# Patient Record
Sex: Female | Born: 1975 | Race: Black or African American | Hispanic: No | Marital: Single | Smoking: Current every day smoker
Health system: Southern US, Community
[De-identification: ages and names within clinical notes are randomized; demographics above are authoritative.]

## PROBLEM LIST (undated history)

## (undated) DIAGNOSIS — Z789 Other specified health status: Secondary | ICD-10-CM

## (undated) DIAGNOSIS — O24419 Gestational diabetes mellitus in pregnancy, unspecified control: Secondary | ICD-10-CM

## (undated) HISTORY — PX: TONSILLECTOMY: SUR1361

## (undated) HISTORY — DX: Other specified health status: Z78.9

## (undated) HISTORY — PX: APPENDECTOMY: SHX54

---

## 2005-04-03 ENCOUNTER — Emergency Department (HOSPITAL_COMMUNITY): Admission: EM | Admit: 2005-04-03 | Discharge: 2005-04-03 | Payer: Self-pay | Admitting: Emergency Medicine

## 2006-01-08 ENCOUNTER — Emergency Department (HOSPITAL_COMMUNITY): Admission: EM | Admit: 2006-01-08 | Discharge: 2006-01-08 | Payer: Self-pay | Admitting: Emergency Medicine

## 2007-07-26 ENCOUNTER — Emergency Department (HOSPITAL_COMMUNITY): Admission: EM | Admit: 2007-07-26 | Discharge: 2007-07-26 | Payer: Self-pay | Admitting: Emergency Medicine

## 2008-09-04 ENCOUNTER — Emergency Department (HOSPITAL_COMMUNITY): Admission: EM | Admit: 2008-09-04 | Discharge: 2008-09-04 | Payer: Self-pay | Admitting: Emergency Medicine

## 2009-01-31 ENCOUNTER — Emergency Department (HOSPITAL_BASED_OUTPATIENT_CLINIC_OR_DEPARTMENT_OTHER): Admission: EM | Admit: 2009-01-31 | Discharge: 2009-01-31 | Payer: Self-pay | Admitting: Emergency Medicine

## 2009-01-31 ENCOUNTER — Ambulatory Visit: Payer: Self-pay | Admitting: Diagnostic Radiology

## 2009-04-24 ENCOUNTER — Emergency Department (HOSPITAL_COMMUNITY): Admission: EM | Admit: 2009-04-24 | Discharge: 2009-04-24 | Payer: Self-pay | Admitting: Family Medicine

## 2009-10-08 ENCOUNTER — Emergency Department (HOSPITAL_COMMUNITY): Admission: EM | Admit: 2009-10-08 | Discharge: 2009-10-08 | Payer: Self-pay | Admitting: Emergency Medicine

## 2010-04-18 LAB — PREGNANCY, URINE: Preg Test, Ur: NEGATIVE

## 2010-05-08 LAB — CBC
Hemoglobin: 14.8 g/dL (ref 12.0–15.0)
MCHC: 33.1 g/dL (ref 30.0–36.0)
RDW: 15.3 % (ref 11.5–15.5)

## 2010-05-08 LAB — DIFFERENTIAL
Basophils Absolute: 0 10*3/uL (ref 0.0–0.1)
Basophils Relative: 0 % (ref 0–1)
Eosinophils Relative: 3 % (ref 0–5)
Lymphocytes Relative: 21 % (ref 12–46)
Neutro Abs: 6.2 10*3/uL (ref 1.7–7.7)

## 2010-05-08 LAB — WET PREP, GENITAL

## 2010-05-08 LAB — BASIC METABOLIC PANEL
CO2: 23 mEq/L (ref 19–32)
Calcium: 9.3 mg/dL (ref 8.4–10.5)
Creatinine, Ser: 0.79 mg/dL (ref 0.4–1.2)
GFR calc Af Amer: >60 mL/min (ref >60)
GFR calc non Af Amer: >60 mL/min (ref >60)
Glucose, Bld: 101 mg/dL — ABNORMAL HIGH (ref 70–99)
Sodium: 137 mEq/L (ref 135–145)

## 2010-05-08 LAB — URINALYSIS, ROUTINE W REFLEX MICROSCOPIC
Glucose, UA: NEGATIVE mg/dL
Nitrite: NEGATIVE
pH: 5.5 (ref 5.0–8.0)

## 2010-05-08 LAB — RPR: RPR Ser Ql: NONREACTIVE

## 2010-05-08 LAB — GC/CHLAMYDIA PROBE AMP, GENITAL: GC Probe Amp, Genital: NEGATIVE

## 2010-10-05 ENCOUNTER — Emergency Department (HOSPITAL_BASED_OUTPATIENT_CLINIC_OR_DEPARTMENT_OTHER)
Admission: EM | Admit: 2010-10-05 | Discharge: 2010-10-05 | Disposition: A | Discharge status: Home / Self Care | Payer: Self-pay | Attending: Emergency Medicine | Admitting: Emergency Medicine

## 2010-10-05 ENCOUNTER — Encounter: Payer: Self-pay | Admitting: *Deleted

## 2010-10-05 ENCOUNTER — Emergency Department (INDEPENDENT_AMBULATORY_CARE_PROVIDER_SITE_OTHER): Payer: Self-pay

## 2010-10-05 DIAGNOSIS — N898 Other specified noninflammatory disorders of vagina: Secondary | ICD-10-CM | POA: Insufficient documentation

## 2010-10-05 DIAGNOSIS — N939 Abnormal uterine and vaginal bleeding, unspecified: Secondary | ICD-10-CM

## 2010-10-05 DIAGNOSIS — F17200 Nicotine dependence, unspecified, uncomplicated: Secondary | ICD-10-CM | POA: Insufficient documentation

## 2010-10-05 LAB — WET PREP, GENITAL: Trich, Wet Prep: NONE SEEN

## 2010-10-05 LAB — PREGNANCY, URINE: Preg Test, Ur: NEGATIVE

## 2010-10-05 MED ORDER — NORGESTREL-ETHINYL ESTRADIOL 0.3-30 MG-MCG PO TABS: 1.0000 | ORAL_TABLET | Freq: Every day | ORAL | Status: DC

## 2010-10-05 MED ORDER — HYDROCODONE-ACETAMINOPHEN 5-325 MG PO TABS: 2.0000 | ORAL_TABLET | ORAL | Status: AC | PRN

## 2010-10-05 MED ORDER — HYDROCODONE-ACETAMINOPHEN 5-325 MG PO TABS
2.0000 | ORAL_TABLET | Freq: Once | ORAL | Status: AC
  Administered 2010-10-05: 2 via ORAL
  Filled 2010-10-05: qty 2

## 2010-10-05 NOTE — ED Notes (Signed)
In to do pt. Vitals and Pt. Not in room.
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
## 2010-10-05 NOTE — ED Notes (Signed)
Vaginal bleeding and abd cramps. 7 weeks preg. Bleeding 2 weeks ago.

## 2010-10-05 NOTE — ED Notes (Signed)
Pt states she has not had a positive preg test. Assumes she is preg.

## 2010-10-05 NOTE — ED Notes (Signed)
Patient given washcloths and towels to freshen up after pelvic exam. She voices no additional concerns at this time

## 2010-10-05 NOTE — ED Provider Notes (Signed)
History     CSN: [id number] Arrival date & time: 10/05/2010  6:44 PM  Chief Complaint  Patient presents with  . Vaginal Bleeding   Patient is a 35 y.o. female presenting with vaginal bleeding. The history is provided by the patient. No language interpreter was used.  Vaginal Bleeding This is a new problem. The current episode started yesterday. The problem occurs constantly. The problem has been unchanged. Associated symptoms include abdominal pain and headaches. Pertinent negatives include no urinary symptoms, vomiting or weakness. The symptoms are aggravated by nothing. She has tried nothing for the symptoms.  Pt reports she had a normal period 2 weeks ago.  Pt reports she awoke today with heavy vaginal bleeding.  Pt reports she has not had a papsmear in 2 years.  Pt reports bleeding is heavier than periods.   History reviewed. No pertinent past medical history.  Past Surgical History  Procedure Date  . Appendectomy   . Tonsillectomy     No family history on file.  History  Substance Use Topics  . Smoking status: Current Everyday Smoker -- 1.0 packs/day  . Smokeless tobacco: Not on file  . Alcohol Use: No    OB History    Grav Para Term Preterm Abortions TAB SAB Ect Mult Living                  Review of Systems  Gastrointestinal: Positive for abdominal pain. Negative for vomiting.  Genitourinary: Positive for vaginal bleeding and pelvic pain.  Neurological: Positive for headaches. Negative for weakness.  All other systems reviewed and are negative.    Physical Exam  BP 123/90  Pulse 68  Temp(Src) 98.1 F (36.7 C) (Oral)  Resp 22  SpO2 100%  LMP 07/21/2010  Physical Exam  Nursing note and vitals reviewed. Constitutional: She is oriented to person, place, and time. She appears well-developed and well-nourished.  HENT:  Head: Normocephalic and atraumatic.  Eyes: Conjunctivae and EOM are normal. Pupils are equal, round, and reactive to light.  Neck: Normal  range of motion. Neck supple.  Cardiovascular: Normal rate.   Pulmonary/Chest: Effort normal.  Abdominal: Soft. There is tenderness.  Genitourinary: Uterus normal.       Mod vaginal bleeding,   No adnexal mass  Musculoskeletal: Normal range of motion.  Neurological: She is alert and oriented to person, place, and time. She has normal reflexes.  Skin: Skin is warm.  Psychiatric: She has a normal mood and affect.    ED Course  Procedures  MDM       Langston Masker, [place] 10/05/10 2216

## 2010-10-05 NOTE — ED Notes (Signed)
Pt. Reports having a Miscarriage 2 yrs ago and had a D&C with a follow up 2 days later with no further visits.  Pt. Reports this was done in [place].

## 2010-10-05 NOTE — ED Provider Notes (Signed)
Evaluation and management procedures were performed by the mid-level provider (PA/NP/CNM) under my supervision/collaboration. I was present and available during the ED course. Roizy Harold Y.   Gavin Pound. Oletta Lamas, MD 10/05/10 2220

## 2010-10-05 NOTE — ED Notes (Signed)
Gave pt. Pads and panties for use.  Pt. Currently having vaginal exam done.

## 2010-10-06 LAB — GC/CHLAMYDIA PROBE AMP, GENITAL: Chlamydia, DNA Probe: NEGATIVE

## 2010-10-28 LAB — RAPID URINE DRUG SCREEN, HOSP PERFORMED
Amphetamines: NOT DETECTED
Barbiturates: NOT DETECTED
Benzodiazepines: POSITIVE — AB
Tetrahydrocannabinol: POSITIVE — AB

## 2010-10-28 LAB — DIFFERENTIAL
Basophils Absolute: 0.2 — ABNORMAL HIGH
Basophils Relative: 2 — ABNORMAL HIGH
Eosinophils Absolute: 0.2
Eosinophils Relative: 2

## 2010-10-28 LAB — POCT I-STAT, CHEM 8
Creatinine, Ser: 1.1
Hemoglobin: 13.6
Sodium: 141
TCO2: 22

## 2010-10-28 LAB — CBC
HCT: 38
RBC: 4.84
RDW: 19 — ABNORMAL HIGH
WBC: 8

## 2011-04-18 ENCOUNTER — Encounter (HOSPITAL_COMMUNITY): Payer: Self-pay

## 2011-04-18 ENCOUNTER — Emergency Department (HOSPITAL_COMMUNITY)
Admission: EM | Admit: 2011-04-18 | Discharge: 2011-04-18 | Disposition: A | Discharge status: Home / Self Care | Payer: Self-pay | Attending: Emergency Medicine | Admitting: Emergency Medicine

## 2011-04-18 ENCOUNTER — Emergency Department (HOSPITAL_COMMUNITY): Payer: Self-pay

## 2011-04-18 DIAGNOSIS — Z0389 Encounter for observation for other suspected diseases and conditions ruled out: Secondary | ICD-10-CM | POA: Insufficient documentation

## 2011-04-18 NOTE — ED Notes (Signed)
Pt called for xray without answer for the second time.
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
## 2011-04-18 NOTE — ED Notes (Signed)
Pt called for xray without answer.

## 2011-04-18 NOTE — ED Notes (Signed)
Attempted to call- no answer

## 2012-10-22 ENCOUNTER — Emergency Department (HOSPITAL_COMMUNITY): Payer: Medicaid Other

## 2012-10-22 ENCOUNTER — Encounter (HOSPITAL_COMMUNITY): Payer: Self-pay | Admitting: Emergency Medicine

## 2012-10-22 ENCOUNTER — Emergency Department (HOSPITAL_COMMUNITY)
Admission: EM | Admit: 2012-10-22 | Discharge: 2012-10-22 | Disposition: A | Discharge status: Home / Self Care | Payer: Medicaid Other | Attending: Emergency Medicine | Admitting: Emergency Medicine

## 2012-10-22 DIAGNOSIS — M25519 Pain in unspecified shoulder: Secondary | ICD-10-CM | POA: Insufficient documentation

## 2012-10-22 DIAGNOSIS — F17200 Nicotine dependence, unspecified, uncomplicated: Secondary | ICD-10-CM | POA: Insufficient documentation

## 2012-10-22 DIAGNOSIS — M542 Cervicalgia: Secondary | ICD-10-CM

## 2012-10-22 DIAGNOSIS — R209 Unspecified disturbances of skin sensation: Secondary | ICD-10-CM | POA: Insufficient documentation

## 2012-10-22 MED ORDER — DIAZEPAM 5 MG PO TABS
5.0000 mg | ORAL_TABLET | Freq: Once | ORAL | Status: AC
  Administered 2012-10-22: 5 mg via ORAL
  Filled 2012-10-22: qty 1

## 2012-10-22 MED ORDER — HYDROCODONE-ACETAMINOPHEN 5-325 MG PO TABS: 1.0000 | ORAL_TABLET | Freq: Four times a day (QID) | ORAL | Status: DC | PRN

## 2012-10-22 MED ORDER — IBUPROFEN 800 MG PO TABS
800.0000 mg | ORAL_TABLET | Freq: Once | ORAL | Status: AC
  Administered 2012-10-22: 800 mg via ORAL
  Filled 2012-10-22: qty 1

## 2012-10-22 MED ORDER — DIAZEPAM 5 MG PO TABS: 5.0000 mg | ORAL_TABLET | Freq: Two times a day (BID) | ORAL | Status: DC

## 2012-10-22 MED ORDER — IBUPROFEN 800 MG PO TABS: 800.0000 mg | ORAL_TABLET | Freq: Three times a day (TID) | ORAL | Status: DC

## 2012-10-22 MED ORDER — HYDROMORPHONE HCL PF 1 MG/ML IJ SOLN
1.0000 mg | Freq: Once | INTRAMUSCULAR | Status: AC
  Administered 2012-10-22: 1 mg via INTRAMUSCULAR
  Filled 2012-10-22: qty 1

## 2012-10-22 NOTE — ED Provider Notes (Signed)
CSN: [id number]     Arrival date & time 10/22/12  0226 History   First MD Initiated Contact with Patient 10/22/12 0235     Chief Complaint  Patient presents with  . Shoulder Pain  . Neck Pain   (Consider location/radiation/quality/duration/timing/severity/associated sxs/prior Treatment) HPI HX per PT  -  R sided neck pain onset 3 days ago, felt like she slept on it wrong and now feels like it is getting worse, hurts to mover her neck, pain radiates to shoulder and earlier had some associated tingling in her R arm. She denies any known trauma. No swelling, no weakness, no fevers, no h/o same. Pain sharp and mod in severity, worse with movement.   History reviewed. No pertinent past medical history. Past Surgical History  Procedure Laterality Date  . Appendectomy    . Tonsillectomy     No family history on file. History  Substance Use Topics  . Smoking status: Current Every Day Smoker -- 1.00 packs/day  . Smokeless tobacco: Not on file  . Alcohol Use: No   OB History   Grav Para Term Preterm Abortions TAB SAB Ect Mult Living                 Review of Systems  Constitutional: Negative for fever and chills.  HENT: Positive for neck pain and neck stiffness.   Eyes: Negative for visual disturbance.  Respiratory: Negative for shortness of breath.   Cardiovascular: Negative for chest pain.  Gastrointestinal: Negative for abdominal pain.  Genitourinary: Negative for dysuria.  Musculoskeletal: Negative for back pain.  Skin: Negative for rash.  Neurological: Negative for weakness and headaches.  All other systems reviewed and are negative.    Allergies  Review of patient's allergies indicates no known allergies.  Home Medications  No current outpatient prescriptions on file. BP 127/78  Pulse 81  Temp(Src) 97.6 F (36.4 C) (Oral)  Resp 16  SpO2 97%  LMP 10/08/2012 Physical Exam  Constitutional: She is oriented to person, place, and time. She appears well-developed and  well-nourished.  HENT:  Head: Normocephalic and atraumatic.  Eyes: EOM are normal. Pupils are equal, round, and reactive to light.  Neck:  Tender R paracervical and trapezial with muscle spasm.  FROM to RUE with equal grips/ bicep/ triceps.  Sensorium to light touch equal and intact throughout. Pain reproducible with lateral rotation of the neck.   Cardiovascular: Normal rate, regular rhythm and intact distal pulses.   Pulmonary/Chest: Effort normal and breath sounds normal. No respiratory distress.  Musculoskeletal: Normal range of motion. She exhibits no edema and no tenderness.  No R shoulder tenderness or deformity  Neurological: She is alert and oriented to person, place, and time.  Skin: Skin is warm and dry.    ED Course  Procedures (including critical care time) Labs Review Labs Reviewed - No data to display Imaging Review Ct Cervical Spine Wo Contrast  10/22/2012   CLINICAL DATA:  Right neck and right shoulder pain for 3 days.  EXAM: CT CERVICAL SPINE WITHOUT CONTRAST  TECHNIQUE: Multidetector CT imaging of the cervical spine was performed without intravenous contrast. Multiplanar CT image reconstructions were also generated.  COMPARISON:  None.  FINDINGS: There is straightening of the normal cervical lordosis. No fracture or subluxation is identified. The patient has a mild disc bulge and right paracentral endplate spur at C5-6. The lung apices are clear.  IMPRESSION: No acute finding.  Degenerative disc disease C5-6.   Electronically Signed   By: Maisie Fus  Dalessio M.D.   On: 10/22/2012 03:44   4:23 AM on recheck still having neck pain, no deficits. IM Dilaudid provided. Plan d/c home with rx VALIUM. MOTRIN and NORCO. NSG referral provided. Return precautions verbalized as understood.   MDM  DX: Right sided neck pain CT scan per RAD IM narcotics and medications provided VS and nurses notes reviewed   Sunnie Nielsen, MD 10/22/12 0425

## 2012-10-22 NOTE — ED Notes (Signed)
Patient transported to CT 

## 2012-10-22 NOTE — ED Notes (Signed)
C/o pain to R side of neck and R shoulder x 3 days.  No known injury.

## 2013-01-31 NOTE — L&D Delivery Note (Signed)
Attestation of Attending Supervision of Advanced Practitioner (PA/CNM/NP): Evaluation and management procedures were performed by the Advanced Practitioner under my supervision and collaboration.  I have reviewed the Advanced Practitioner's note and chart, and I agree with the management and plan.  Miquela Costabile, MD, FACOG Attending Obstetrician & Gynecologist Faculty Practice, [facility]   

## 2013-01-31 NOTE — L&D Delivery Note (Signed)
Delivery Note At 9:57 PM a viable female was delivered via Vaginal, Spontaneous Delivery (Presentation: ; Occiput Anterior).  APGAR: 8, 9; weight pending skin-skin.   Placenta status: Intact, Spontaneous.  Cord: 3 vessels with the following complications: None.   Anesthesia: None  Episiotomy: None Lacerations: None, 2 anterior superficial abrasions not requiring repair Suture Repair: none Est. Blood Loss (mL): 300  Mom to postpartum.  Baby to Couplet care / Skin to Skin.  Tawni CarnesWight, Andrew 11/07/2013, 10:25 PM   I was present for the delivery and agree with the above. CRESENZO-DISHMAN,Gautam Langhorst

## 2013-04-02 ENCOUNTER — Ambulatory Visit: Payer: Self-pay

## 2013-04-09 ENCOUNTER — Encounter: Payer: Self-pay | Admitting: *Deleted

## 2013-04-09 ENCOUNTER — Ambulatory Visit (INDEPENDENT_AMBULATORY_CARE_PROVIDER_SITE_OTHER): Payer: Self-pay | Admitting: *Deleted

## 2013-04-09 VITALS — BP 112/79 | HR 77 | Ht 72.0 in

## 2013-04-09 DIAGNOSIS — Z3201 Encounter for pregnancy test, result positive: Secondary | ICD-10-CM

## 2013-04-09 LAB — HIV ANTIBODY (ROUTINE TESTING W REFLEX): HIV: NONREACTIVE

## 2013-04-09 LAB — POCT PREGNANCY, URINE: Preg Test, Ur: POSITIVE — AB

## 2013-04-09 NOTE — Progress Notes (Signed)
Patient in for pregnancy test. Pregnancy test positive. She thinks her lmp was around 01/27/14 but has sporadic periods so is not sure. Ultrasound for dating and viability scheduled for 04/10/13 at 0815. Patient needs early 1hr and will do this at her next visit.

## 2013-04-10 ENCOUNTER — Ambulatory Visit (HOSPITAL_COMMUNITY): Payer: Medicaid Other

## 2013-04-10 LAB — OBSTETRIC PANEL
ANTIBODY SCREEN: NEGATIVE
BASOS ABS: 0 10*3/uL (ref 0.0–0.1)
BASOS PCT: 0 % (ref 0–1)
EOS PCT: 1 % (ref 0–5)
Eosinophils Absolute: 0.1 10*3/uL (ref 0.0–0.7)
HEMATOCRIT: 41 % (ref 36.0–46.0)
HEMOGLOBIN: 14 g/dL (ref 12.0–15.0)
Hepatitis B Surface Ag: NEGATIVE
LYMPHS PCT: 25 % (ref 12–46)
Lymphs Abs: 2.2 10*3/uL (ref 0.7–4.0)
MCH: 30.1 pg (ref 26.0–34.0)
MCHC: 34.1 g/dL (ref 30.0–36.0)
MCV: 88.2 fL (ref 78.0–100.0)
MONO ABS: 0.5 10*3/uL (ref 0.1–1.0)
MONOS PCT: 6 % (ref 3–12)
NEUTROS ABS: 5.8 10*3/uL (ref 1.7–7.7)
Neutrophils Relative %: 68 % (ref 43–77)
Platelets: 350 10*3/uL (ref 150–400)
RBC: 4.65 MIL/uL (ref 3.87–5.11)
RDW: 15.8 % — AB (ref 11.5–15.5)
RH TYPE: POSITIVE
RUBELLA: 9.63 {index} — AB (ref <0.90)
WBC: 8.6 10*3/uL (ref 4.0–10.5)

## 2013-04-11 ENCOUNTER — Ambulatory Visit (HOSPITAL_COMMUNITY)
Admission: RE | Admit: 2013-04-11 | Discharge: 2013-04-11 | Disposition: A | Discharge status: Home / Self Care | Payer: Medicaid Other | Source: Ambulatory Visit | Attending: Obstetrics & Gynecology | Admitting: Obstetrics & Gynecology

## 2013-04-11 DIAGNOSIS — Z3689 Encounter for other specified antenatal screening: Secondary | ICD-10-CM | POA: Insufficient documentation

## 2013-04-11 DIAGNOSIS — O368990 Maternal care for other specified fetal problems, unspecified trimester, not applicable or unspecified: Secondary | ICD-10-CM | POA: Insufficient documentation

## 2013-04-11 DIAGNOSIS — O43899 Other placental disorders, unspecified trimester: Principal | ICD-10-CM

## 2013-04-11 DIAGNOSIS — Z3201 Encounter for pregnancy test, result positive: Secondary | ICD-10-CM

## 2013-04-11 LAB — HEMOGLOBINOPATHY EVALUATION
Hemoglobin Other: 0 %
Hgb A2 Quant: 2.6 % (ref 2.2–3.2)
Hgb A: 97.4 % (ref 96.8–97.8)
Hgb F Quant: 0 % (ref 0.0–2.0)
Hgb S Quant: 0 %

## 2013-04-11 LAB — CULTURE, OB URINE

## 2013-04-11 LAB — CANNABANOIDS (GC/LC/MS), URINE: THC-COOH UR CONFIRM: 828 ng/mL — AB

## 2013-04-12 LAB — PRESCRIPTION MONITORING PROFILE (19 PANEL)
Amphetamine/Meth: NEGATIVE ng/mL
Barbiturate Screen, Urine: NEGATIVE ng/mL
Buprenorphine, Urine: NEGATIVE ng/mL
CARISOPRODOL, URINE: NEGATIVE ng/mL
COCAINE METABOLITES: NEGATIVE ng/mL
CREATININE, URINE: 196.09 mg/dL (ref >20.0)
ECSTASY: NEGATIVE ng/mL
FENTANYL URINE: NEGATIVE ng/mL
MEPERIDINE UR: NEGATIVE ng/mL
METHADONE SCREEN, URINE: NEGATIVE ng/mL
Methaqualone: NEGATIVE ng/mL
Nitrites, Initial: NEGATIVE ug/mL
OXYCODONE SCRN UR: NEGATIVE ng/mL
Opiate Screen, Urine: NEGATIVE ng/mL
PH URINE, INITIAL: 7.8 pH (ref 4.5–8.9)
PHENCYCLIDINE, UR: NEGATIVE ng/mL
Propoxyphene: NEGATIVE ng/mL
TRAMADOL UR: NEGATIVE ng/mL
Tapentadol, urine: NEGATIVE ng/mL
Zolpidem, Urine: NEGATIVE ng/mL

## 2013-04-12 LAB — BENZODIAZEPINES (GC/LC/MS), URINE
Alprazolam metabolite (GC/LC/MS), ur confirm: NEGATIVE ng/mL
Clonazepam metabolite (GC/LC/MS), ur confirm: NEGATIVE ng/mL
Diazepam (GC/LC/MS), ur confirm: NEGATIVE ng/mL
ESTAZOLAMU: NEGATIVE ng/mL
FLURAZEPAMU: NEGATIVE ng/mL
Flunitrazepam metabolite (GC/LC/MS), ur confirm: NEGATIVE ng/mL
HYDROXYALPRAZ UR: NEGATIVE ng/mL
Lorazepam (GC/LC/MS), ur confirm: NEGATIVE ng/mL
MIDAZOLAMU: NEGATIVE ng/mL
NORDIAZEPAMU: NEGATIVE ng/mL
Oxazepam (GC/LC/MS), ur confirm: NEGATIVE ng/mL
Temazepam (GC/LC/MS), ur confirm: NEGATIVE ng/mL
Triazolam metabolite (GC/LC/MS), ur confirm: NEGATIVE ng/mL

## 2013-05-06 ENCOUNTER — Encounter: Payer: Self-pay | Admitting: Obstetrics & Gynecology

## 2013-05-06 ENCOUNTER — Ambulatory Visit (INDEPENDENT_AMBULATORY_CARE_PROVIDER_SITE_OTHER): Payer: Self-pay | Admitting: Obstetrics & Gynecology

## 2013-05-06 VITALS — BP 122/85 | Wt 287.2 lb

## 2013-05-06 DIAGNOSIS — Z3400 Encounter for supervision of normal first pregnancy, unspecified trimester: Secondary | ICD-10-CM

## 2013-05-06 DIAGNOSIS — O09512 Supervision of elderly primigravida, second trimester: Secondary | ICD-10-CM

## 2013-05-06 DIAGNOSIS — O09519 Supervision of elderly primigravida, unspecified trimester: Secondary | ICD-10-CM | POA: Insufficient documentation

## 2013-05-06 DIAGNOSIS — Z3480 Encounter for supervision of other normal pregnancy, unspecified trimester: Secondary | ICD-10-CM

## 2013-05-06 DIAGNOSIS — Z3490 Encounter for supervision of normal pregnancy, unspecified, unspecified trimester: Secondary | ICD-10-CM

## 2013-05-06 LAB — POCT URINALYSIS DIP (DEVICE)
Bilirubin Urine: NEGATIVE
Glucose, UA: NEGATIVE mg/dL
KETONES UR: NEGATIVE mg/dL
Leukocytes, UA: NEGATIVE
Nitrite: NEGATIVE
PROTEIN: NEGATIVE mg/dL
SPECIFIC GRAVITY, URINE: 1.025 (ref 1.005–1.030)
Urobilinogen, UA: 0.2 mg/dL (ref 0.0–1.0)
pH: 6 (ref 5.0–8.0)

## 2013-05-06 LAB — OB RESULTS CONSOLE GC/CHLAMYDIA
CHLAMYDIA, DNA PROBE: NEGATIVE
Gonorrhea: NEGATIVE

## 2013-05-06 NOTE — Progress Notes (Signed)
P=103  Pt is experiencing nausea/vomiting and is concerned she is becoming dehydrated.  Pt desires to sign refusal of any drug screen in pregnancy.  States she asked specifically las time if urine was being tested and we stated no.  Pt feels she was lied to and has a right to be informed of testing.

## 2013-05-06 NOTE — Progress Notes (Signed)
   Subjective: NOB    Colleen Wiggins is a G1P0 7470w5d being seen today for her first obstetrical visit.  Her obstetrical history is significant for advanced maternal age and obesity. Patient does intend to breast feed. Pregnancy history fully reviewed.  Patient reports nausea and vomiting.  Filed Vitals:   05/06/13 0941 05/06/13 0950  BP: 128/95 122/85  Weight: 287 lb 3.2 oz (130.273 kg)     HISTORY: OB History  Gravida Para Term Preterm AB SAB TAB Ectopic Multiple Living  1         0    # Outcome Date GA Lbr Len/2nd Weight Sex Delivery Anes PTL Lv  1 CUR              Past Medical History  Diagnosis Date  . Medical history non-contributory    Past Surgical History  Procedure Laterality Date  . Appendectomy    . Tonsillectomy     Family History  Problem Relation Age of Onset  . Diabetes Father   . Diabetes Paternal Grandmother      Exam    Uterus:     Pelvic Exam:    Perineum: No Hemorrhoids   Vulva: normal   Vagina:  normal mucosa   pH:  wet prep done   Cervix: no lesions   Adnexa: normal adnexa   Bony Pelvis: average  System: Breast:  normal appearance, no masses or tenderness   Skin: normal coloration and turgor, no rashes    Neurologic: oriented, occasionally tearful   Extremities: normal strength, tone, and muscle mass   HEENT PERRLA and oropharynx clear, no lesions   Mouth/Teeth mucous membranes moist, pharynx normal without lesions   Neck supple and no masses   Cardiovascular: regular rate and rhythm, no murmurs or gallops   Respiratory:  appears well, vitals normal, no respiratory distress, acyanotic, normal RR, neck free of mass or lymphadenopathy, chest clear, no wheezing, crepitations, rhonchi, normal symmetric air entry   Abdomen: soft, non-tender; bowel sounds normal; no masses,  no organomegaly   Urinary: urethral meatus normal      Assessment:    Pregnancy: G1P0 Patient Active Problem List   Diagnosis Date Noted  . Supervision of  low-risk first pregnancy 05/06/2013  . Elderly primigravida in second trimester 05/06/2013        Plan:     Initial labs drawn. Prenatal vitamins. Problem list reviewed and updated. Genetic Screening discussed First Screen and AFP Screen: requested.  Ultrasound discussed; fetal survey: results reviewed.  Follow up in 4 weeks. 50% of 30 min visit spent on counseling and coordination of care.  Quit smoking, rec [facility]   Karina Nofsinger 05/06/2013

## 2013-05-06 NOTE — Patient Instructions (Signed)
Second Trimester of Pregnancy The second trimester is from week 13 through week 28, months 4 through 6. The second trimester is often a time when you feel your best. Your body has also adjusted to being pregnant, and you begin to feel better physically. Usually, morning sickness has lessened or quit completely, you may have more energy, and you may have an increase in appetite. The second trimester is also a time when the fetus is growing rapidly. At the end of the sixth month, the fetus is about 9 inches long and weighs about 1 pounds. You will likely begin to feel the baby move (quickening) between 18 and 20 weeks of the pregnancy. BODY CHANGES Your body goes through many changes during pregnancy. The changes vary from woman to woman.   Your weight will continue to increase. You will notice your lower abdomen bulging out.  You may begin to get stretch marks on your hips, abdomen, and breasts.  You may develop headaches that can be relieved by medicines approved by your caregiver.  You may urinate more often because the fetus is pressing on your bladder.  You may develop or continue to have heartburn as a result of your pregnancy.  You may develop constipation because certain hormones are causing the muscles that push waste through your intestines to slow down.  You may develop hemorrhoids or swollen, bulging veins (varicose veins).  You may have back pain because of the weight gain and pregnancy hormones relaxing your joints between the bones in your pelvis and as a result of a shift in weight and the muscles that support your balance.  Your breasts will continue to grow and be tender.  Your gums may bleed and may be sensitive to brushing and flossing.  Dark spots or blotches (chloasma, mask of pregnancy) may develop on your face. This will likely fade after the baby is born.  A dark line from your belly button to the pubic area (linea nigra) may appear. This will likely fade after the  baby is born. WHAT TO EXPECT AT YOUR PRENATAL VISITS During a routine prenatal visit:  You will be weighed to make sure you and the fetus are growing normally.  Your blood pressure will be taken.  Your abdomen will be measured to track your baby's growth.  The fetal heartbeat will be listened to.  Any test results from the previous visit will be discussed. Your caregiver may ask you:  How you are feeling.  If you are feeling the baby move.  If you have had any abnormal symptoms, such as leaking fluid, bleeding, severe headaches, or abdominal cramping.  If you have any questions. Other tests that may be performed during your second trimester include:  Blood tests that check for:  Low iron levels (anemia).  Gestational diabetes (between 24 and 28 weeks).  Rh antibodies.  Urine tests to check for infections, diabetes, or protein in the urine.  An ultrasound to confirm the proper growth and development of the baby.  An amniocentesis to check for possible genetic problems.  Fetal screens for spina bifida and Down syndrome. HOME CARE INSTRUCTIONS   Avoid all smoking, herbs, alcohol, and unprescribed drugs. These chemicals affect the formation and growth of the baby.  Follow your caregiver's instructions regarding medicine use. There are medicines that are either safe or unsafe to take during pregnancy.  Exercise only as directed by your caregiver. Experiencing uterine cramps is a good sign to stop exercising.  Continue to eat regular,   healthy meals.  Wear a good support bra for breast tenderness.  Do not use hot tubs, steam rooms, or saunas.  Wear your seat belt at all times when driving.  Avoid raw meat, uncooked cheese, cat litter boxes, and soil used by cats. These carry germs that can cause birth defects in the baby.  Take your prenatal vitamins.  Try taking a stool softener (if your caregiver approves) if you develop constipation. Eat more high-fiber foods,  such as fresh vegetables or fruit and whole grains. Drink plenty of fluids to keep your urine clear or pale yellow.  Take warm sitz baths to soothe any pain or discomfort caused by hemorrhoids. Use hemorrhoid cream if your caregiver approves.  If you develop varicose veins, wear support hose. Elevate your feet for 15 minutes, 3 4 times a day. Limit salt in your diet.  Avoid heavy lifting, wear low heel shoes, and practice good posture.  Rest with your legs elevated if you have leg cramps or low back pain.  Visit your dentist if you have not gone yet during your pregnancy. Use a soft toothbrush to brush your teeth and be gentle when you floss.  A sexual relationship may be continued unless your caregiver directs you otherwise.  Continue to go to all your prenatal visits as directed by your caregiver. SEEK MEDICAL CARE IF:   You have dizziness.  You have mild pelvic cramps, pelvic pressure, or nagging pain in the abdominal area.  You have persistent nausea, vomiting, or diarrhea.  You have a bad smelling vaginal discharge.  You have pain with urination. SEEK IMMEDIATE MEDICAL CARE IF:   You have a fever.  You are leaking fluid from your vagina.  You have spotting or bleeding from your vagina.  You have severe abdominal cramping or pain.  You have rapid weight gain or loss.  You have shortness of breath with chest pain.  You notice sudden or extreme swelling of your face, hands, ankles, feet, or legs.  You have not felt your baby move in over an hour.  You have severe headaches that do not go away with medicine.  You have vision changes. Document Released: 01/11/2001 Document Revised: 09/19/2012 Document Reviewed: 03/20/2012 Exit[employer] Patient Information 2014 [employer].  

## 2013-05-07 ENCOUNTER — Encounter: Payer: Self-pay | Admitting: Obstetrics & Gynecology

## 2013-05-07 LAB — WET PREP, GENITAL
TRICH WET PREP: NONE SEEN
WBC, Wet Prep HPF POC: NONE SEEN
YEAST WET PREP: NONE SEEN

## 2013-05-07 LAB — GC/CHLAMYDIA PROBE AMP
CT PROBE, AMP APTIMA: NEGATIVE
GC Probe RNA: NEGATIVE

## 2013-05-07 LAB — GLUCOSE TOLERANCE, 1 HOUR (50G) W/O FASTING: GLUCOSE 1 HOUR GTT: 165 mg/dL — AB (ref 70–140)

## 2013-05-28 ENCOUNTER — Telehealth: Payer: Self-pay

## 2013-05-28 NOTE — Telephone Encounter (Signed)
Message copied by Louanna RawAMPBELL, Patrycja Mumpower M on Tue May 28, 2013  8:32 AM ------      Message from: Odelia GageLINTON, CHERYL A      Created: Tue May 28, 2013  6:51 AM       This patient has had her New OB appointment, right? She will need a 3 hr GTT Per Dr. Debroah LoopArnold. Can someone please call this patient and give her the information needed to take this test.             Thank you            ----- Message -----         From: Adam PhenixJames G Arnold, MD         Sent: 05/12/2013  11:16 AM           To: [facility] Admin Pool            Schedule 3 hr GTT       ------

## 2013-05-28 NOTE — Telephone Encounter (Signed)
Called pt. And informed her of results. Pt. States she can come in tomorrow at 0800 for 3hr gtt. Advised pt. To must be fasting and cannot have anything to eat or drink, except water, after midnight. Pt. Verbalized understanding. No further questions or concerns.

## 2013-05-29 ENCOUNTER — Other Ambulatory Visit: Payer: Medicaid Other

## 2013-05-29 DIAGNOSIS — O99810 Abnormal glucose complicating pregnancy: Secondary | ICD-10-CM

## 2013-05-30 ENCOUNTER — Telehealth: Payer: Self-pay | Admitting: *Deleted

## 2013-05-30 ENCOUNTER — Encounter: Payer: Self-pay | Admitting: Obstetrics & Gynecology

## 2013-05-30 DIAGNOSIS — O24319 Unspecified pre-existing diabetes mellitus in pregnancy, unspecified trimester: Secondary | ICD-10-CM | POA: Insufficient documentation

## 2013-05-30 LAB — GLUCOSE TOLERANCE, 3 HOURS
GLUCOSE, FASTING-GESTATIONAL: 97 mg/dL (ref 70–104)
Glucose Tolerance, 1 hour: 197 mg/dL — ABNORMAL HIGH (ref 70–189)
Glucose Tolerance, 2 hour: 148 mg/dL (ref 70–164)
Glucose, GTT - 3 Hour: 111 mg/dL (ref 70–144)

## 2013-05-30 NOTE — Telephone Encounter (Signed)
Spoke with patient concerning GDM results, informed patient that we would get her an appointment time and let her know tomorrow.  Pt gave permission for us to leave a voicemail if needed.  Informed patient if she did not receive message to call at 0800 on Monday morning.  Pt verbalizes understanding.

## 2013-05-30 NOTE — Telephone Encounter (Signed)
Message copied by Dorothyann PengHAIZLIP, Prakriti Carignan E on Thu May 30, 2013  4:22 PM ------      Message from: Jaynie CollinsANYANWU, UGONNA A      Created: Thu May 30, 2013  2:09 PM       Patient has GDM. Needs DM education and supplies ASAP; [facility]  Please call to inform patient of results and recommendations. Problem list updated.       ------

## 2013-05-31 ENCOUNTER — Telehealth: Payer: Self-pay | Admitting: *Deleted

## 2013-05-31 NOTE — Telephone Encounter (Signed)
Message copied by Dorothyann PengHAIZLIP, [employer] E on Fri May 31, 2013  8:23 AM ------      Message from: Odelia GageLINTON, CHERYL A      Created: Thu May 30, 2013  4:39 PM       appointment made for 10:30 on Monday      ----- Message -----         From: Candelaria Stagersandace Abree Romick, RN         Sent: 05/30/2013   4:20 PM           To: Phillips Odorheryl A Clinton            Pt had GDM, note from provider states she needs DM education and [facility]  I will call patient and inform of results if you can let her know appointment for Monday tomorrow.            Thanks,            Tauheed Mcfayden       ------

## 2013-05-31 NOTE — Telephone Encounter (Signed)
Contacted patient to inform of education appointment, spoke with patient, informed of education appointment.  Pt states she does not have insurance.  Informed her the clinic provides the meter.  Pt verbalizes understanding.

## 2013-06-03 ENCOUNTER — Encounter: Payer: Medicaid Other | Attending: Obstetrics & Gynecology | Admitting: *Deleted

## 2013-06-03 ENCOUNTER — Ambulatory Visit (INDEPENDENT_AMBULATORY_CARE_PROVIDER_SITE_OTHER): Payer: Self-pay | Admitting: Family Medicine

## 2013-06-03 DIAGNOSIS — Z713 Dietary counseling and surveillance: Secondary | ICD-10-CM | POA: Insufficient documentation

## 2013-06-03 DIAGNOSIS — O99810 Abnormal glucose complicating pregnancy: Secondary | ICD-10-CM

## 2013-06-03 MED ORDER — ACCU-CHEK FASTCLIX LANCETS MISC: 1.0000 | Freq: Four times a day (QID) | Status: DC

## 2013-06-03 MED ORDER — GLUCOSE BLOOD VI STRP: ORAL_STRIP | Status: DC

## 2013-06-03 NOTE — Progress Notes (Signed)
GDM:  Patient was seen on 06/03/13 for Gestational Diabetes self-management . The following learning objectives were met by the patient:   States the definition of Gestational Diabetes  States why dietary management is important in controlling blood glucose  States when to check blood glucose levels  Demonstrates proper blood glucose monitoring techniques  States the effect of stress and exercise on blood glucose levels  Plan:  Consider  increasing your activity level by walking daily as tolerated Begin checking BG before breakfast and 2 hours after first bit of breakfast, lunch and dinner after  as directed by MD  Take medication  as directed by MD  Blood glucose monitor given: [employer] Lot # [id number] Exp: 06/14/2015 Blood glucose reading: 97  Patient instructed to monitor glucose levels: FBS: 60 - <90 2 hour: <120  Patient received the following handouts:  Nutrition Diabetes and Pregnancy  Patient will be seen for follow-up as needed.

## 2013-06-05 ENCOUNTER — Encounter: Payer: Self-pay | Admitting: Obstetrics and Gynecology

## 2013-06-10 ENCOUNTER — Ambulatory Visit (INDEPENDENT_AMBULATORY_CARE_PROVIDER_SITE_OTHER): Payer: Self-pay | Admitting: Obstetrics & Gynecology

## 2013-06-10 VITALS — BP 142/87 | HR 102 | Temp 98.1°F | Wt 287.3 lb

## 2013-06-10 DIAGNOSIS — Z23 Encounter for immunization: Secondary | ICD-10-CM

## 2013-06-10 DIAGNOSIS — O99810 Abnormal glucose complicating pregnancy: Secondary | ICD-10-CM

## 2013-06-10 DIAGNOSIS — E669 Obesity, unspecified: Secondary | ICD-10-CM

## 2013-06-10 DIAGNOSIS — O10919 Unspecified pre-existing hypertension complicating pregnancy, unspecified trimester: Secondary | ICD-10-CM

## 2013-06-10 DIAGNOSIS — O24919 Unspecified diabetes mellitus in pregnancy, unspecified trimester: Secondary | ICD-10-CM

## 2013-06-10 DIAGNOSIS — O99210 Obesity complicating pregnancy, unspecified trimester: Secondary | ICD-10-CM | POA: Insufficient documentation

## 2013-06-10 DIAGNOSIS — O24319 Unspecified pre-existing diabetes mellitus in pregnancy, unspecified trimester: Secondary | ICD-10-CM

## 2013-06-10 DIAGNOSIS — O09519 Supervision of elderly primigravida, unspecified trimester: Secondary | ICD-10-CM

## 2013-06-10 DIAGNOSIS — E119 Type 2 diabetes mellitus without complications: Secondary | ICD-10-CM

## 2013-06-10 DIAGNOSIS — O10019 Pre-existing essential hypertension complicating pregnancy, unspecified trimester: Secondary | ICD-10-CM

## 2013-06-10 LAB — COMPREHENSIVE METABOLIC PANEL
ALK PHOS: 62 U/L (ref 39–117)
ALT: 22 U/L (ref 0–35)
AST: 18 U/L (ref 0–37)
Albumin: 3.8 g/dL (ref 3.5–5.2)
BILIRUBIN TOTAL: 0.3 mg/dL (ref 0.2–1.2)
BUN: 7 mg/dL (ref 6–23)
CO2: 19 mEq/L (ref 19–32)
Calcium: 9.8 mg/dL (ref 8.4–10.5)
Chloride: 103 mEq/L (ref 96–112)
Creat: 0.66 mg/dL (ref 0.50–1.10)
GLUCOSE: 85 mg/dL (ref 70–99)
Potassium: 4.6 mEq/L (ref 3.5–5.3)
Sodium: 135 mEq/L (ref 135–145)
Total Protein: 6.8 g/dL (ref 6.0–8.3)

## 2013-06-10 LAB — POCT URINALYSIS DIP (DEVICE)
BILIRUBIN URINE: NEGATIVE
GLUCOSE, UA: NEGATIVE mg/dL
HGB URINE DIPSTICK: NEGATIVE
Ketones, ur: 15 mg/dL — AB
Leukocytes, UA: NEGATIVE
Nitrite: NEGATIVE
Protein, ur: 30 mg/dL — AB
Urobilinogen, UA: 0.2 mg/dL (ref 0.0–1.0)
pH: 6 (ref 5.0–8.0)

## 2013-06-10 LAB — CBC
HCT: 39.4 % (ref 36.0–46.0)
HEMOGLOBIN: 13.7 g/dL (ref 12.0–15.0)
MCH: 30.4 pg (ref 26.0–34.0)
MCHC: 34.8 g/dL (ref 30.0–36.0)
MCV: 87.4 fL (ref 78.0–100.0)
Platelets: 322 10*3/uL (ref 150–400)
RBC: 4.51 MIL/uL (ref 3.87–5.11)
RDW: 14.8 % (ref 11.5–15.5)
WBC: 11.2 10*3/uL — ABNORMAL HIGH (ref 4.0–10.5)

## 2013-06-10 LAB — HEMOGLOBIN A1C
Hgb A1c MFr Bld: 5.8 % — ABNORMAL HIGH (ref <5.7)
Mean Plasma Glucose: 120 mg/dL — ABNORMAL HIGH (ref <117)

## 2013-06-10 LAB — TSH: TSH: 1.725 u[IU]/mL (ref 0.350–4.500)

## 2013-06-10 MED ORDER — ASPIRIN EC 81 MG PO TBEC: 81.0000 mg | DELAYED_RELEASE_TABLET | Freq: Every day | ORAL | Status: DC

## 2013-06-10 MED ORDER — GLYBURIDE 2.5 MG PO TABS: 1.2500 mg | ORAL_TABLET | Freq: Two times a day (BID) | ORAL | Status: DC

## 2013-06-10 NOTE — Progress Notes (Signed)
Patient was recently diagnosed with A2/B  DM, was given supplies on 06/03/13. Fasting 105/104/95/111/105/106/97/106. 2 hr pp B 141/137/119/151/123/110; L 147/118/126/134/97/109/166; D 103/134/115/130/154/127 Glyburide 1.25 mg po bid ordered, will reevaluate next week. [x]  Baseline labs ordered today [x]  Baby ASA prescribed [x]  Fetal ECHO ordered [x]  Optho exam ordered Patient informed of need for serial growth scans 20-24-28-32-35-38, antenatal testing starting at 32 weeks and delivery by 39 weeks or earlier if needed.  Anatomy scan ordered. [facility]; had two elevated BP prior to 20 weeks.  Same testing guidelines as above. Problem list updated. Quad screen done today.  No other complaints or concerns.  Obstetric precautions reviewed.

## 2013-06-10 NOTE — Patient Instructions (Signed)
Return to clinic for any obstetric concerns or go to [facility] for evaluation  

## 2013-06-10 NOTE — Progress Notes (Signed)
MFM U/S scheduled 06/27/13 at 1 pm. Fetal Echo with Dr. Elizebeth Brookingotton scheduled 07/29/13 at 9 am. Retinal Scan scheduled with [facility] on 06/25/13 at 130 pm.

## 2013-06-11 ENCOUNTER — Encounter: Payer: Self-pay | Admitting: Obstetrics & Gynecology

## 2013-06-11 LAB — PROTEIN / CREATININE RATIO, URINE
Creatinine, Urine: 252.4 mg/dL
PROTEIN CREATININE RATIO: 0.06 (ref <0.15)
TOTAL PROTEIN, URINE: 14 mg/dL

## 2013-06-12 LAB — AFP, QUAD SCREEN
AFP: 26.3 IU/mL
Age Alone: 1:251 {titer}
CURR GEST AGE: 17.5 wks.days
HCG, Total: 28922 m[IU]/mL
INH: 338.9 pg/mL
INTERPRETATION-AFP: POSITIVE — AB
MOM FOR HCG: 2.55
MOM FOR INH: 2.79
MoM for AFP: 1.21
Open Spina bifida: NEGATIVE
Tri 18 Scr Risk Est: NEGATIVE
UE3 MOM: 1.04
uE3 Value: 0.7 ng/mL

## 2013-06-17 ENCOUNTER — Telehealth: Payer: Self-pay | Admitting: *Deleted

## 2013-06-17 NOTE — Telephone Encounter (Signed)
Pt called nurse line requesting results.  Spoke with the patient and informed of the AFP result elevated and the number 1:195, informed that the [facility] appoinment on 06/27/2013 they will be able to answer her questions concerning the numbers.  Pt also states she is out of test strips and has no insurance.  Informed patient to come by clinic to pick up strips.  Pt verbalizes understanding.

## 2013-06-18 ENCOUNTER — Encounter: Payer: Self-pay | Admitting: Obstetrics & Gynecology

## 2013-06-25 ENCOUNTER — Ambulatory Visit: Payer: Self-pay | Admitting: Home Health Services

## 2013-06-27 ENCOUNTER — Ambulatory Visit (HOSPITAL_COMMUNITY)
Admission: RE | Admit: 2013-06-27 | Discharge: 2013-06-27 | Disposition: A | Discharge status: Home / Self Care | Payer: Medicaid Other | Source: Ambulatory Visit | Attending: Obstetrics & Gynecology | Admitting: Obstetrics & Gynecology

## 2013-06-27 DIAGNOSIS — E119 Type 2 diabetes mellitus without complications: Secondary | ICD-10-CM | POA: Insufficient documentation

## 2013-06-27 DIAGNOSIS — O24319 Unspecified pre-existing diabetes mellitus in pregnancy, unspecified trimester: Secondary | ICD-10-CM

## 2013-06-27 DIAGNOSIS — O99210 Obesity complicating pregnancy, unspecified trimester: Secondary | ICD-10-CM

## 2013-06-27 DIAGNOSIS — E669 Obesity, unspecified: Secondary | ICD-10-CM | POA: Insufficient documentation

## 2013-06-27 DIAGNOSIS — O09519 Supervision of elderly primigravida, unspecified trimester: Secondary | ICD-10-CM

## 2013-06-27 DIAGNOSIS — O24919 Unspecified diabetes mellitus in pregnancy, unspecified trimester: Secondary | ICD-10-CM | POA: Insufficient documentation

## 2013-06-27 DIAGNOSIS — O10919 Unspecified pre-existing hypertension complicating pregnancy, unspecified trimester: Secondary | ICD-10-CM

## 2013-06-27 DIAGNOSIS — O10019 Pre-existing essential hypertension complicating pregnancy, unspecified trimester: Secondary | ICD-10-CM | POA: Insufficient documentation

## 2013-07-01 ENCOUNTER — Ambulatory Visit (INDEPENDENT_AMBULATORY_CARE_PROVIDER_SITE_OTHER): Payer: Self-pay | Admitting: Family Medicine

## 2013-07-01 ENCOUNTER — Encounter: Payer: Self-pay | Admitting: *Deleted

## 2013-07-01 VITALS — BP 126/75 | HR 86 | Temp 97.2°F | Wt 283.3 lb

## 2013-07-01 DIAGNOSIS — O24919 Unspecified diabetes mellitus in pregnancy, unspecified trimester: Secondary | ICD-10-CM

## 2013-07-01 DIAGNOSIS — O09519 Supervision of elderly primigravida, unspecified trimester: Secondary | ICD-10-CM

## 2013-07-01 DIAGNOSIS — E669 Obesity, unspecified: Secondary | ICD-10-CM

## 2013-07-01 DIAGNOSIS — E119 Type 2 diabetes mellitus without complications: Secondary | ICD-10-CM

## 2013-07-01 DIAGNOSIS — O10919 Unspecified pre-existing hypertension complicating pregnancy, unspecified trimester: Secondary | ICD-10-CM

## 2013-07-01 DIAGNOSIS — O10019 Pre-existing essential hypertension complicating pregnancy, unspecified trimester: Secondary | ICD-10-CM

## 2013-07-01 DIAGNOSIS — O24319 Unspecified pre-existing diabetes mellitus in pregnancy, unspecified trimester: Secondary | ICD-10-CM

## 2013-07-01 DIAGNOSIS — O99210 Obesity complicating pregnancy, unspecified trimester: Secondary | ICD-10-CM

## 2013-07-01 LAB — POCT URINALYSIS DIP (DEVICE)
Glucose, UA: NEGATIVE mg/dL
Nitrite: NEGATIVE
Protein, ur: NEGATIVE mg/dL
Specific Gravity, Urine: >=1.03 (ref 1.005–1.030)
Urobilinogen, UA: 0.2 mg/dL (ref 0.0–1.0)
pH: 5.5 (ref 5.0–8.0)

## 2013-07-01 MED ORDER — GLYBURIDE 2.5 MG PO TABS: 2.5000 mg | ORAL_TABLET | Freq: Two times a day (BID) | ORAL | Status: DC

## 2013-07-01 NOTE — Progress Notes (Signed)
S: 38 yo G1 @ [redacted]w[redacted]d here for ROBV - lancet pen broke. Hasn't been able to check her blood sugar in the last week - on glyburide 2.5mg  BID - fasting from 2 weeks ago at goal 80s (highest 91) - 2h PP- 90-120s  O: see flowsheet  A/P - sugars recorded at goal - replaced lancet pen today - recommend checking sugars as before on 2.5mg  bid of glyburide and if cont to be elevated, urged to call to adjust prior to appt - [place] reviewed from MFM- normal

## 2013-07-01 NOTE — Progress Notes (Signed)
Patient requests proof of pregnancy letter for Medicaid.

## 2013-07-01 NOTE — Patient Instructions (Signed)
Second Trimester of Pregnancy The second trimester is from week 13 through week 28, months 4 through 6. The second trimester is often a time when you feel your best. Your body has also adjusted to being pregnant, and you begin to feel better physically. Usually, morning sickness has lessened or quit completely, you may have more energy, and you may have an increase in appetite. The second trimester is also a time when the fetus is growing rapidly. At the end of the sixth month, the fetus is about 9 inches long and weighs about 1 pounds. You will likely begin to feel the baby move (quickening) between 18 and 20 weeks of the pregnancy. BODY CHANGES Your body goes through many changes during pregnancy. The changes vary from woman to woman.   Your weight will continue to increase. You will notice your lower abdomen bulging out.  You may begin to get stretch marks on your hips, abdomen, and breasts.  You may develop headaches that can be relieved by medicines approved by your caregiver.  You may urinate more often because the fetus is pressing on your bladder.  You may develop or continue to have heartburn as a result of your pregnancy.  You may develop constipation because certain hormones are causing the muscles that push waste through your intestines to slow down.  You may develop hemorrhoids or swollen, bulging veins (varicose veins).  You may have back pain because of the weight gain and pregnancy hormones relaxing your joints between the bones in your pelvis and as a result of a shift in weight and the muscles that support your balance.  Your breasts will continue to grow and be tender.  Your gums may bleed and may be sensitive to brushing and flossing.  Dark spots or blotches (chloasma, mask of pregnancy) may develop on your face. This will likely fade after the baby is born.  A dark line from your belly button to the pubic area (linea nigra) may appear. This will likely fade after the  baby is born. WHAT TO EXPECT AT YOUR PRENATAL VISITS During a routine prenatal visit:  You will be weighed to make sure you and the fetus are growing normally.  Your blood pressure will be taken.  Your abdomen will be measured to track your baby's growth.  The fetal heartbeat will be listened to.  Any test results from the previous visit will be discussed. Your caregiver may ask you:  How you are feeling.  If you are feeling the baby move.  If you have had any abnormal symptoms, such as leaking fluid, bleeding, severe headaches, or abdominal cramping.  If you have any questions. Other tests that may be performed during your second trimester include:  Blood tests that check for:  Low iron levels (anemia).  Gestational diabetes (between 24 and 28 weeks).  Rh antibodies.  Urine tests to check for infections, diabetes, or protein in the urine.  An ultrasound to confirm the proper growth and development of the baby.  An amniocentesis to check for possible genetic problems.  Fetal screens for spina bifida and Down syndrome. HOME CARE INSTRUCTIONS   Avoid all smoking, herbs, alcohol, and unprescribed drugs. These chemicals affect the formation and growth of the baby.  Follow your caregiver's instructions regarding medicine use. There are medicines that are either safe or unsafe to take during pregnancy.  Exercise only as directed by your caregiver. Experiencing uterine cramps is a good sign to stop exercising.  Continue to eat regular,   healthy meals.  Wear a good support bra for breast tenderness.  Do not use hot tubs, steam rooms, or saunas.  Wear your seat belt at all times when driving.  Avoid raw meat, uncooked cheese, cat litter boxes, and soil used by cats. These carry germs that can cause birth defects in the baby.  Take your prenatal vitamins.  Try taking a stool softener (if your caregiver approves) if you develop constipation. Eat more high-fiber foods,  such as fresh vegetables or fruit and whole grains. Drink plenty of fluids to keep your urine clear or pale yellow.  Take warm sitz baths to soothe any pain or discomfort caused by hemorrhoids. Use hemorrhoid cream if your caregiver approves.  If you develop varicose veins, wear support hose. Elevate your feet for 15 minutes, 3 4 times a day. Limit salt in your diet.  Avoid heavy lifting, wear low heel shoes, and practice good posture.  Rest with your legs elevated if you have leg cramps or low back pain.  Visit your dentist if you have not gone yet during your pregnancy. Use a soft toothbrush to brush your teeth and be gentle when you floss.  A sexual relationship may be continued unless your caregiver directs you otherwise.  Continue to go to all your prenatal visits as directed by your caregiver. SEEK MEDICAL CARE IF:   You have dizziness.  You have mild pelvic cramps, pelvic pressure, or nagging pain in the abdominal area.  You have persistent nausea, vomiting, or diarrhea.  You have a bad smelling vaginal discharge.  You have pain with urination. SEEK IMMEDIATE MEDICAL CARE IF:   You have a fever.  You are leaking fluid from your vagina.  You have spotting or bleeding from your vagina.  You have severe abdominal cramping or pain.  You have rapid weight gain or loss.  You have shortness of breath with chest pain.  You notice sudden or extreme swelling of your face, hands, ankles, feet, or legs.  You have not felt your baby move in over an hour.  You have severe headaches that do not go away with medicine.  You have vision changes. Document Released: 01/11/2001 Document Revised: 09/19/2012 Document Reviewed: 03/20/2012 Exit[employer] Patient Information 2014 [employer].  

## 2013-07-03 ENCOUNTER — Other Ambulatory Visit: Payer: Self-pay | Admitting: Family Medicine

## 2013-07-03 DIAGNOSIS — O24919 Unspecified diabetes mellitus in pregnancy, unspecified trimester: Secondary | ICD-10-CM

## 2013-07-03 DIAGNOSIS — O09529 Supervision of elderly multigravida, unspecified trimester: Secondary | ICD-10-CM

## 2013-07-15 ENCOUNTER — Ambulatory Visit (INDEPENDENT_AMBULATORY_CARE_PROVIDER_SITE_OTHER): Payer: Self-pay | Admitting: Obstetrics & Gynecology

## 2013-07-15 ENCOUNTER — Encounter: Payer: Self-pay | Admitting: Obstetrics & Gynecology

## 2013-07-15 VITALS — BP 128/85 | HR 97 | Temp 97.9°F | Wt 282.5 lb

## 2013-07-15 DIAGNOSIS — O09519 Supervision of elderly primigravida, unspecified trimester: Secondary | ICD-10-CM

## 2013-07-15 DIAGNOSIS — O24319 Unspecified pre-existing diabetes mellitus in pregnancy, unspecified trimester: Secondary | ICD-10-CM

## 2013-07-15 DIAGNOSIS — O24919 Unspecified diabetes mellitus in pregnancy, unspecified trimester: Secondary | ICD-10-CM

## 2013-07-15 DIAGNOSIS — E119 Type 2 diabetes mellitus without complications: Secondary | ICD-10-CM

## 2013-07-15 LAB — POCT URINALYSIS DIP (DEVICE)
Bilirubin Urine: NEGATIVE
Glucose, UA: NEGATIVE mg/dL
Ketones, ur: 40 mg/dL — AB
Nitrite: NEGATIVE
Protein, ur: 30 mg/dL — AB
Specific Gravity, Urine: >=1.03 (ref 1.005–1.030)
Urobilinogen, UA: 0.2 mg/dL (ref 0.0–1.0)
pH: 6 (ref 5.0–8.0)

## 2013-07-15 NOTE — Patient Instructions (Signed)
Return to clinic for any obstetric concerns or go to [facility] for evaluation  

## 2013-07-15 NOTE — Progress Notes (Signed)
Reports pain in top of right hand and top of left foot; reports difficulty with losing weight and blood sugars being high despite hardly eating anything and being hungry all the time

## 2013-07-15 NOTE — Progress Notes (Addendum)
DIABETES: Documented variance in glucose readings. Reviewed technique and replaced TrueTest glucometer with new meter and 2 box of test strips. Lot: [id number] EXp: 06/14/15

## 2013-07-15 NOTE — Progress Notes (Signed)
Fastings 106/107/104/103/99/111/102/110  2 hr PP B 115/120/117/175/146/146  L 121/112/202/162/126  D 117/142/121/125/136/160/117 Will increase Glyburide to 5 mg po bid, reevaluate next week.  Reports problems with meter, will see DM educator. No other complaints or concerns. Reassured about extremity "tingling" , no concerning symptoms.  Routine obstetric precautions reviewed.

## 2013-07-22 ENCOUNTER — Ambulatory Visit (INDEPENDENT_AMBULATORY_CARE_PROVIDER_SITE_OTHER): Payer: Self-pay | Admitting: Obstetrics & Gynecology

## 2013-07-22 VITALS — BP 111/75 | HR 99 | Temp 98.0°F | Wt 282.7 lb

## 2013-07-22 DIAGNOSIS — O09519 Supervision of elderly primigravida, unspecified trimester: Secondary | ICD-10-CM

## 2013-07-22 DIAGNOSIS — O09512 Supervision of elderly primigravida, second trimester: Secondary | ICD-10-CM

## 2013-07-22 LAB — POCT URINALYSIS DIP (DEVICE)
BILIRUBIN URINE: NEGATIVE
GLUCOSE, UA: NEGATIVE mg/dL
HGB URINE DIPSTICK: NEGATIVE
Ketones, ur: 80 mg/dL — AB
NITRITE: POSITIVE — AB
Protein, ur: 30 mg/dL — AB
SPECIFIC GRAVITY, URINE: 1.025 (ref 1.005–1.030)
Urobilinogen, UA: 0.2 mg/dL (ref 0.0–1.0)
pH: 6 (ref 5.0–8.0)

## 2013-07-22 MED ORDER — GLYBURIDE 5 MG PO TABS: ORAL_TABLET | ORAL | Status: DC

## 2013-07-22 NOTE — Progress Notes (Signed)
FBS 94-107 post meals 96-151, post supper 117-182   Increase to 10 mg PM and cont 5 mg am

## 2013-07-22 NOTE — Patient Instructions (Signed)
Second Trimester of Pregnancy The second trimester is from week 13 through week 28, months 4 through 6. The second trimester is often a time when you feel your best. Your body has also adjusted to being pregnant, and you begin to feel better physically. Usually, morning sickness has lessened or quit completely, you may have more energy, and you may have an increase in appetite. The second trimester is also a time when the fetus is growing rapidly. At the end of the sixth month, the fetus is about 9 inches long and weighs about 1 pounds. You will likely begin to feel the baby move (quickening) between 18 and 20 weeks of the pregnancy. BODY CHANGES Your body goes through many changes during pregnancy. The changes vary from woman to woman.   Your weight will continue to increase. You will notice your lower abdomen bulging out.  You may begin to get stretch marks on your hips, abdomen, and breasts.  You may develop headaches that can be relieved by medicines approved by your health care provider.  You may urinate more often because the fetus is pressing on your bladder.  You may develop or continue to have heartburn as a result of your pregnancy.  You may develop constipation because certain hormones are causing the muscles that push waste through your intestines to slow down.  You may develop hemorrhoids or swollen, bulging veins (varicose veins).  You may have back pain because of the weight gain and pregnancy hormones relaxing your joints between the bones in your pelvis and as a result of a shift in weight and the muscles that support your balance.  Your breasts will continue to grow and be tender.  Your gums may bleed and may be sensitive to brushing and flossing.  Dark spots or blotches (chloasma, mask of pregnancy) may develop on your face. This will likely fade after the baby is born.  A dark line from your belly button to the pubic area (linea nigra) may appear. This will likely fade  after the baby is born.  You may have changes in your hair. These can include thickening of your hair, rapid growth, and changes in texture. Some women also have hair loss during or after pregnancy, or hair that feels dry or thin. Your hair will most likely return to normal after your baby is born. WHAT TO EXPECT AT YOUR PRENATAL VISITS During a routine prenatal visit:  You will be weighed to make sure you and the fetus are growing normally.  Your blood pressure will be taken.  Your abdomen will be measured to track your baby's growth.  The fetal heartbeat will be listened to.  Any test results from the previous visit will be discussed. Your health care provider may ask you:  How you are feeling.  If you are feeling the baby move.  If you have had any abnormal symptoms, such as leaking fluid, bleeding, severe headaches, or abdominal cramping.  If you have any questions. Other tests that may be performed during your second trimester include:  Blood tests that check for:  Low iron levels (anemia).  Gestational diabetes (between 24 and 28 weeks).  Rh antibodies.  Urine tests to check for infections, diabetes, or protein in the urine.  An ultrasound to confirm the proper growth and development of the baby.  An amniocentesis to check for possible genetic problems.  Fetal screens for spina bifida and Down syndrome. HOME CARE INSTRUCTIONS   Avoid all smoking, herbs, alcohol, and unprescribed   drugs. These chemicals affect the formation and growth of the baby.  Follow your health care provider's instructions regarding medicine use. There are medicines that are either safe or unsafe to take during pregnancy.  Exercise only as directed by your health care provider. Experiencing uterine cramps is a good sign to stop exercising.  Continue to eat regular, healthy meals.  Wear a good support bra for breast tenderness.  Do not use hot tubs, steam rooms, or saunas.  Wear your  seat belt at all times when driving.  Avoid raw meat, uncooked cheese, cat litter boxes, and soil used by cats. These carry germs that can cause birth defects in the baby.  Take your prenatal vitamins.  Try taking a stool softener (if your health care provider approves) if you develop constipation. Eat more high-fiber foods, such as fresh vegetables or fruit and whole grains. Drink plenty of fluids to keep your urine clear or pale yellow.  Take warm sitz baths to soothe any pain or discomfort caused by hemorrhoids. Use hemorrhoid cream if your health care provider approves.  If you develop varicose veins, wear support hose. Elevate your feet for 15 minutes, 3-4 times a day. Limit salt in your diet.  Avoid heavy lifting, wear low heel shoes, and practice good posture.  Rest with your legs elevated if you have leg cramps or low back pain.  Visit your dentist if you have not gone yet during your pregnancy. Use a soft toothbrush to brush your teeth and be gentle when you floss.  A sexual relationship may be continued unless your health care provider directs you otherwise.  Continue to go to all your prenatal visits as directed by your health care provider. SEEK MEDICAL CARE IF:   You have dizziness.  You have mild pelvic cramps, pelvic pressure, or nagging pain in the abdominal area.  You have persistent nausea, vomiting, or diarrhea.  You have a bad smelling vaginal discharge.  You have pain with urination. SEEK IMMEDIATE MEDICAL CARE IF:   You have a fever.  You are leaking fluid from your vagina.  You have spotting or bleeding from your vagina.  You have severe abdominal cramping or pain.  You have rapid weight gain or loss.  You have shortness of breath with chest pain.  You notice sudden or extreme swelling of your face, hands, ankles, feet, or legs.  You have not felt your baby move in over an hour.  You have severe headaches that do not go away with  medicine.  You have vision changes. Document Released: 01/11/2001 Document Revised: 01/22/2013 Document Reviewed: 03/20/2012 [employer] Patient Information 2015 [employer]. This information is not intended to replace advice given to you by your health care provider. Make sure you discuss any questions you have with your health care provider.  

## 2013-07-24 ENCOUNTER — Telehealth: Payer: Self-pay | Admitting: *Deleted

## 2013-07-24 ENCOUNTER — Telehealth: Payer: Self-pay

## 2013-07-24 MED ORDER — AMOXICILLIN 500 MG PO CAPS: 500.0000 mg | ORAL_CAPSULE | Freq: Three times a day (TID) | ORAL | Status: DC

## 2013-07-24 NOTE — Telephone Encounter (Signed)
Amoxicillin 500 mg TID x1 week Rx sent to pt pharmacy.  Called pt to let her know about Rx.

## 2013-07-24 NOTE — Telephone Encounter (Signed)
Colleen Wiggins called and left a message that she has called earlier to get referral to dental clinic.States she is 5 .5 months pregnant and believes she has a broken tooth and an abcess above her gum line. States she has called the dental clinic and they said they wouldn't even see her if she has an abcess and isn't on an antibiotic.States please call me.

## 2013-07-24 NOTE — Telephone Encounter (Signed)
Patient called stating she has a chipped tooth and now has an abccess that she believes in infected and is causing her to lose sleep. Would like referral to dental clinic. Referral letter along with patient demographics and last clinic visit note faxed to dental clinic [phone]. Called patient and informed her that referral was being sent and patient could call to make appointment.

## 2013-07-25 ENCOUNTER — Ambulatory Visit (HOSPITAL_COMMUNITY)
Admission: RE | Admit: 2013-07-25 | Discharge: 2013-07-25 | Disposition: A | Discharge status: Home / Self Care | Payer: Medicaid Other | Source: Ambulatory Visit | Attending: Obstetrics & Gynecology | Admitting: Obstetrics & Gynecology

## 2013-07-25 DIAGNOSIS — O24919 Unspecified diabetes mellitus in pregnancy, unspecified trimester: Secondary | ICD-10-CM | POA: Diagnosis not present

## 2013-07-25 DIAGNOSIS — Z3689 Encounter for other specified antenatal screening: Secondary | ICD-10-CM | POA: Insufficient documentation

## 2013-07-25 DIAGNOSIS — O09529 Supervision of elderly multigravida, unspecified trimester: Secondary | ICD-10-CM | POA: Diagnosis present

## 2013-07-25 DIAGNOSIS — E119 Type 2 diabetes mellitus without complications: Secondary | ICD-10-CM | POA: Insufficient documentation

## 2013-07-30 ENCOUNTER — Encounter: Payer: Self-pay | Admitting: Family Medicine

## 2013-08-01 ENCOUNTER — Other Ambulatory Visit: Payer: Self-pay | Admitting: Family Medicine

## 2013-08-01 DIAGNOSIS — O99210 Obesity complicating pregnancy, unspecified trimester: Secondary | ICD-10-CM

## 2013-08-01 DIAGNOSIS — O24919 Unspecified diabetes mellitus in pregnancy, unspecified trimester: Secondary | ICD-10-CM

## 2013-08-01 DIAGNOSIS — E669 Obesity, unspecified: Secondary | ICD-10-CM

## 2013-08-01 DIAGNOSIS — O09519 Supervision of elderly primigravida, unspecified trimester: Secondary | ICD-10-CM

## 2013-08-05 ENCOUNTER — Ambulatory Visit (INDEPENDENT_AMBULATORY_CARE_PROVIDER_SITE_OTHER): Payer: Self-pay | Admitting: Family Medicine

## 2013-08-05 ENCOUNTER — Encounter: Payer: Self-pay | Admitting: Family Medicine

## 2013-08-05 VITALS — BP 123/79 | HR 89 | Temp 97.3°F | Wt 278.9 lb

## 2013-08-05 DIAGNOSIS — L738 Other specified follicular disorders: Secondary | ICD-10-CM

## 2013-08-05 DIAGNOSIS — O09513 Supervision of elderly primigravida, third trimester: Secondary | ICD-10-CM

## 2013-08-05 DIAGNOSIS — L678 Other hair color and hair shaft abnormalities: Secondary | ICD-10-CM

## 2013-08-05 DIAGNOSIS — Z3400 Encounter for supervision of normal first pregnancy, unspecified trimester: Secondary | ICD-10-CM

## 2013-08-05 LAB — POCT URINALYSIS DIP (DEVICE)
BILIRUBIN URINE: NEGATIVE
Glucose, UA: NEGATIVE mg/dL
Hgb urine dipstick: NEGATIVE
Ketones, ur: NEGATIVE mg/dL
Nitrite: NEGATIVE
Protein, ur: NEGATIVE mg/dL
SPECIFIC GRAVITY, URINE: 1.015 (ref 1.005–1.030)
Urobilinogen, UA: 0.2 mg/dL (ref 0.0–1.0)
pH: 7 (ref 5.0–8.0)

## 2013-08-05 MED ORDER — CEPHALEXIN 500 MG PO CAPS: 500.0000 mg | ORAL_CAPSULE | Freq: Four times a day (QID) | ORAL | Status: AC

## 2013-08-05 NOTE — Progress Notes (Signed)
+  FM, no lf, no vb, no ctx  Complains Of rash under Right under amr C/w folliculitis - tx keflex 500 qid for 7d  F 97/105/88/86/69/79/102/73 B95/98/93/150/89/99/98 L 110/113/109/117/136/119/167 D115/113/119/163/174/114/109 Glyburide 5mg /10mg  - continue current regimen Echo reschedule for after insurance ~4wk US scheduelled end of mothn  BP at goal today Has lost weight - but baby growing  Colleen Wiggins is a 38 y.o. G1P0 at [id number] by R=9 here for ROB visit.  Discussed with Patient:  -Plans to breast feed.  All questions answered. -Continue prenatal vitamins. 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   Patient provided with all pertinent phone numbers for emergencies. - RTC for any VB, regular, painful cramps/ctxs occurring at a rate of >2/10 min, fever (100.5 or higher), n/v/d, any pain that is unresolving or worsening, LOF, decreased fetal movement, CP, SOB, edema  Problems: Patient Active Problem List   Diagnosis Date Noted  . Chronic hypertension complicating pregnancy, antepartum 06/10/2013  . Obesity in pregnancy, antepartum 06/10/2013  . Class A2/B diabetes complicating pregnancy, antepartum 05/30/2013  . Supervision of high-risk pregnancy of elderly primigravida 05/06/2013  . Advanced maternal age, primigravida, antepartum 05/06/2013    To Do: F/u 2weeks  [ ]  Vaccines:  Tdap: Tdap next visit  [ ]  BCM: declines  Edu: [ x] PTL precautions; [ ]  BF class; [ ]  childbirth class; [ ]   BF counseling;

## 2013-08-05 NOTE — Patient Instructions (Signed)
Second Trimester of Pregnancy The second trimester is from week 13 through week 28, months 4 through 6. The second trimester is often a time when you feel your best. Your body has also adjusted to being pregnant, and you begin to feel better physically. Usually, morning sickness has lessened or quit completely, you may have more energy, and you may have an increase in appetite. The second trimester is also a time when the fetus is growing rapidly. At the end of the sixth month, the fetus is about 9 inches long and weighs about 1 pounds. You will likely begin to feel the baby move (quickening) between 18 and 20 weeks of the pregnancy. BODY CHANGES Your body goes through many changes during pregnancy. The changes vary from woman to woman.   Your weight will continue to increase. You will notice your lower abdomen bulging out.  You may begin to get stretch marks on your hips, abdomen, and breasts.  You may develop headaches that can be relieved by medicines approved by your health care provider.  You may urinate more often because the fetus is pressing on your bladder.  You may develop or continue to have heartburn as a result of your pregnancy.  You may develop constipation because certain hormones are causing the muscles that push waste through your intestines to slow down.  You may develop hemorrhoids or swollen, bulging veins (varicose veins).  You may have back pain because of the weight gain and pregnancy hormones relaxing your joints between the bones in your pelvis and as a result of a shift in weight and the muscles that support your balance.  Your breasts will continue to grow and be tender.  Your gums may bleed and may be sensitive to brushing and flossing.  Dark spots or blotches (chloasma, mask of pregnancy) may develop on your face. This will likely fade after the baby is born.  A dark line from your belly button to the pubic area (linea nigra) may appear. This will likely fade  after the baby is born.  You may have changes in your hair. These can include thickening of your hair, rapid growth, and changes in texture. Some women also have hair loss during or after pregnancy, or hair that feels dry or thin. Your hair will most likely return to normal after your baby is born. WHAT TO EXPECT AT YOUR PRENATAL VISITS During a routine prenatal visit:  You will be weighed to make sure you and the fetus are growing normally.  Your blood pressure will be taken.  Your abdomen will be measured to track your baby's growth.  The fetal heartbeat will be listened to.  Any test results from the previous visit will be discussed. Your health care provider may ask you:  How you are feeling.  If you are feeling the baby move.  If you have had any abnormal symptoms, such as leaking fluid, bleeding, severe headaches, or abdominal cramping.  If you have any questions. Other tests that may be performed during your second trimester include:  Blood tests that check for:  Low iron levels (anemia).  Gestational diabetes (between 24 and 28 weeks).  Rh antibodies.  Urine tests to check for infections, diabetes, or protein in the urine.  An ultrasound to confirm the proper growth and development of the baby.  An amniocentesis to check for possible genetic problems.  Fetal screens for spina bifida and Down syndrome. HOME CARE INSTRUCTIONS   Avoid all smoking, herbs, alcohol, and unprescribed   drugs. These chemicals affect the formation and growth of the baby.  Follow your health care provider's instructions regarding medicine use. There are medicines that are either safe or unsafe to take during pregnancy.  Exercise only as directed by your health care provider. Experiencing uterine cramps is a good sign to stop exercising.  Continue to eat regular, healthy meals.  Wear a good support bra for breast tenderness.  Do not use hot tubs, steam rooms, or saunas.  Wear your  seat belt at all times when driving.  Avoid raw meat, uncooked cheese, cat litter boxes, and soil used by cats. These carry germs that can cause birth defects in the baby.  Take your prenatal vitamins.  Try taking a stool softener (if your health care provider approves) if you develop constipation. Eat more high-fiber foods, such as fresh vegetables or fruit and whole grains. Drink plenty of fluids to keep your urine clear or pale yellow.  Take warm sitz baths to soothe any pain or discomfort caused by hemorrhoids. Use hemorrhoid cream if your health care provider approves.  If you develop varicose veins, wear support hose. Elevate your feet for 15 minutes, 3-4 times a day. Limit salt in your diet.  Avoid heavy lifting, wear low heel shoes, and practice good posture.  Rest with your legs elevated if you have leg cramps or low back pain.  Visit your dentist if you have not gone yet during your pregnancy. Use a soft toothbrush to brush your teeth and be gentle when you floss.  A sexual relationship may be continued unless your health care provider directs you otherwise.  Continue to go to all your prenatal visits as directed by your health care provider. SEEK MEDICAL CARE IF:   You have dizziness.  You have mild pelvic cramps, pelvic pressure, or nagging pain in the abdominal area.  You have persistent nausea, vomiting, or diarrhea.  You have a bad smelling vaginal discharge.  You have pain with urination. SEEK IMMEDIATE MEDICAL CARE IF:   You have a fever.  You are leaking fluid from your vagina.  You have spotting or bleeding from your vagina.  You have severe abdominal cramping or pain.  You have rapid weight gain or loss.  You have shortness of breath with chest pain.  You notice sudden or extreme swelling of your face, hands, ankles, feet, or legs.  You have not felt your baby move in over an hour.  You have severe headaches that do not go away with  medicine.  You have vision changes. Document Released: 01/11/2001 Document Revised: 01/22/2013 Document Reviewed: 03/20/2012 [employer] Patient Information 2015 [employer]. This information is not intended to replace advice given to you by your health care provider. Make sure you discuss any questions you have with your health care provider.  

## 2013-08-05 NOTE — Progress Notes (Signed)
Patient reports feeling lightheaded and dizzy occasional at night with night sweats-- reports having checked sugar last night when feeling lightheaded and sweating and was 108.

## 2013-08-19 ENCOUNTER — Ambulatory Visit (INDEPENDENT_AMBULATORY_CARE_PROVIDER_SITE_OTHER): Payer: Self-pay | Admitting: Obstetrics & Gynecology

## 2013-08-19 ENCOUNTER — Encounter: Payer: Self-pay | Admitting: Obstetrics & Gynecology

## 2013-08-19 VITALS — BP 109/62 | HR 100 | Temp 98.3°F | Wt 281.8 lb

## 2013-08-19 DIAGNOSIS — Z3492 Encounter for supervision of normal pregnancy, unspecified, second trimester: Secondary | ICD-10-CM

## 2013-08-19 DIAGNOSIS — O09513 Supervision of elderly primigravida, third trimester: Secondary | ICD-10-CM

## 2013-08-19 DIAGNOSIS — O09519 Supervision of elderly primigravida, unspecified trimester: Secondary | ICD-10-CM

## 2013-08-19 DIAGNOSIS — Z23 Encounter for immunization: Secondary | ICD-10-CM

## 2013-08-19 DIAGNOSIS — Z3480 Encounter for supervision of other normal pregnancy, unspecified trimester: Secondary | ICD-10-CM

## 2013-08-19 LAB — CBC
HEMATOCRIT: 37.5 % (ref 36.0–46.0)
HEMOGLOBIN: 13 g/dL (ref 12.0–15.0)
MCH: 30.3 pg (ref 26.0–34.0)
MCHC: 34.7 g/dL (ref 30.0–36.0)
MCV: 87.4 fL (ref 78.0–100.0)
Platelets: 272 10*3/uL (ref 150–400)
RBC: 4.29 MIL/uL (ref 3.87–5.11)
RDW: 14.4 % (ref 11.5–15.5)
WBC: 11.7 10*3/uL — ABNORMAL HIGH (ref 4.0–10.5)

## 2013-08-19 LAB — POCT URINALYSIS DIP (DEVICE)
Bilirubin Urine: NEGATIVE
Glucose, UA: NEGATIVE mg/dL
Ketones, ur: NEGATIVE mg/dL
NITRITE: NEGATIVE
PH: 8.5 — AB (ref 5.0–8.0)
PROTEIN: NEGATIVE mg/dL
Specific Gravity, Urine: 1.015 (ref 1.005–1.030)
UROBILINOGEN UA: 0.2 mg/dL (ref 0.0–1.0)

## 2013-08-19 MED ORDER — TETANUS-DIPHTH-ACELL PERTUSSIS 5-2.5-18.5 LF-MCG/0.5 IM SUSP: 0.5000 mL | Freq: Once | INTRAMUSCULAR | Status: DC

## 2013-08-19 MED ORDER — GLYBURIDE 5 MG PO TABS: 10.0000 mg | ORAL_TABLET | Freq: Two times a day (BID) | ORAL | Status: DC

## 2013-08-19 NOTE — Progress Notes (Signed)
Patient has dental appointment [facility] [address] [place] on July 21,2015.

## 2013-08-19 NOTE — Progress Notes (Signed)
Pt is having pain in Right hip for 1.5 week, Pt is concerned about tooth abscess, took antibiotic but has been unable to be seen by a dentist.

## 2013-08-19 NOTE — Progress Notes (Signed)
Fastings all below 90s.  After breakfast below 120.  Both lunch and dinner are 135-150.  Will increase glyburide to 10 mg 2x day.  This may drop the pp breakfast.  Pt will call if hypoglycemia occurs.  Can consider 1 1/2 pills in the a.m. If this occurs. Needs dental referral. US for growth this Thursday. Needs dental--RN staff to work on this today.

## 2013-08-20 ENCOUNTER — Other Ambulatory Visit: Payer: Self-pay | Admitting: Obstetrics & Gynecology

## 2013-08-20 DIAGNOSIS — A749 Chlamydial infection, unspecified: Secondary | ICD-10-CM

## 2013-08-20 DIAGNOSIS — O98812 Other maternal infectious and parasitic diseases complicating pregnancy, second trimester: Principal | ICD-10-CM

## 2013-08-20 LAB — RPR

## 2013-08-20 LAB — HIV ANTIBODY (ROUTINE TESTING W REFLEX): HIV: NONREACTIVE

## 2013-08-20 MED ORDER — AZITHROMYCIN 500 MG PO TABS: ORAL_TABLET | ORAL | Status: DC

## 2013-08-20 NOTE — Progress Notes (Signed)
Pt's chlamydia test is positive.  Rx at pharmacy.  RN to call pt with tet result an plan.

## 2013-08-21 NOTE — Progress Notes (Signed)
Dr Penne LashLeggett, I think you may have the wrong patient. I cannot see where this patient was recently tested. Thanks!

## 2013-08-22 ENCOUNTER — Other Ambulatory Visit: Payer: Self-pay | Admitting: Obstetrics & Gynecology

## 2013-08-22 ENCOUNTER — Telehealth: Payer: Self-pay

## 2013-08-22 ENCOUNTER — Encounter (HOSPITAL_COMMUNITY): Payer: Self-pay

## 2013-08-22 ENCOUNTER — Ambulatory Visit (HOSPITAL_COMMUNITY)
Admission: RE | Admit: 2013-08-22 | Discharge: 2013-08-22 | Disposition: A | Discharge status: Home / Self Care | Payer: Medicaid Other | Source: Ambulatory Visit | Attending: Obstetrics & Gynecology | Admitting: Obstetrics & Gynecology

## 2013-08-22 VITALS — BP 140/77 | HR 93 | Wt 282.2 lb

## 2013-08-22 DIAGNOSIS — O09519 Supervision of elderly primigravida, unspecified trimester: Secondary | ICD-10-CM | POA: Diagnosis not present

## 2013-08-22 DIAGNOSIS — O99210 Obesity complicating pregnancy, unspecified trimester: Secondary | ICD-10-CM

## 2013-08-22 DIAGNOSIS — O10019 Pre-existing essential hypertension complicating pregnancy, unspecified trimester: Secondary | ICD-10-CM | POA: Diagnosis not present

## 2013-08-22 DIAGNOSIS — O24919 Unspecified diabetes mellitus in pregnancy, unspecified trimester: Secondary | ICD-10-CM | POA: Diagnosis not present

## 2013-08-22 DIAGNOSIS — O10912 Unspecified pre-existing hypertension complicating pregnancy, second trimester: Secondary | ICD-10-CM

## 2013-08-22 DIAGNOSIS — E669 Obesity, unspecified: Secondary | ICD-10-CM | POA: Diagnosis not present

## 2013-08-22 DIAGNOSIS — O09512 Supervision of elderly primigravida, second trimester: Secondary | ICD-10-CM

## 2013-08-22 NOTE — Telephone Encounter (Signed)
Patient called very upset after seeing in mychart that she had chlamydia and was not called and informed of this. Called patient and apologized profusely for the error. Explained to patient that she does not have chlamydia and that it was entered in error in her chart by the provider. Informed her that her chart will be amended and the diagnosis will be removed. Explained that in order to amend the chart the appropriate people need to be contacted and paper work filled out so that it is handled per protocol. Informed patient she will need to fill out some paper work but that she can do that at her appointment on Monday 08/26/13. Patient verbalized understanding and stated she understands mistakes happen but that this almost broke up her and her husband's relationship. Patient states she picked up the medication that was prescribed and took it right away. Informed patient that the medication will not harm her or her baby but re-iterrated that she does not have chlamydia and that we are so sorry for the inconvenience. Patient verbalized understanding and gratitude. No further questions or concerns.

## 2013-08-26 ENCOUNTER — Ambulatory Visit (INDEPENDENT_AMBULATORY_CARE_PROVIDER_SITE_OTHER): Payer: Medicaid Other | Admitting: Family Medicine

## 2013-08-26 ENCOUNTER — Other Ambulatory Visit: Payer: Self-pay | Admitting: Obstetrics & Gynecology

## 2013-08-26 ENCOUNTER — Encounter: Payer: Self-pay | Admitting: *Deleted

## 2013-08-26 VITALS — BP 125/76 | HR 102 | Temp 98.3°F | Wt 283.0 lb

## 2013-08-26 DIAGNOSIS — O24313 Unspecified pre-existing diabetes mellitus in pregnancy, third trimester: Secondary | ICD-10-CM

## 2013-08-26 DIAGNOSIS — O09519 Supervision of elderly primigravida, unspecified trimester: Secondary | ICD-10-CM

## 2013-08-26 DIAGNOSIS — O24919 Unspecified diabetes mellitus in pregnancy, unspecified trimester: Secondary | ICD-10-CM

## 2013-08-26 DIAGNOSIS — E119 Type 2 diabetes mellitus without complications: Secondary | ICD-10-CM

## 2013-08-26 DIAGNOSIS — O09513 Supervision of elderly primigravida, third trimester: Secondary | ICD-10-CM

## 2013-08-26 LAB — POCT URINALYSIS DIP (DEVICE)
Bilirubin Urine: NEGATIVE
Glucose, UA: NEGATIVE mg/dL
Ketones, ur: NEGATIVE mg/dL
Nitrite: NEGATIVE
PH: 7 (ref 5.0–8.0)
Protein, ur: NEGATIVE mg/dL
Specific Gravity, Urine: 1.015 (ref 1.005–1.030)
UROBILINOGEN UA: 0.2 mg/dL (ref 0.0–1.0)

## 2013-08-26 NOTE — Addendum Note (Signed)
Addended by: Lesly DukesLEGGETT, Jayln Madeira H on: 08/26/2013 02:55 PM   Modules accepted: Orders

## 2013-08-26 NOTE — Progress Notes (Signed)
Documentation of positive chlamydia was entered in error on [employer].  Pt picked up message via my chart before the erroneous mesage could be removed from Epic.  Patient was not called to inform patient or to pick up medication.  Pt picked up medicine from pharmacy and took the azithromycin.  Pt and husband are understandably upset.  Fortunately, azithromycin is an antibiotic safe in pregnancy.  Will continue to monitor patient closely.

## 2013-08-26 NOTE — Patient Instructions (Addendum)
Following an appropriate diet and keeping your blood sugar under control is the most important thing to do for your health and that of your unborn baby.  Please check your blood sugar 4 times daily.  Please keep accurate BS logs and bring them with you to every visit.  Please bring your meter also.  Goals for Blood sugar should be: 1. Fasting (first thing in the morning before eating) should be less than 90.   2.  2 hours after meals should be less than 120.  Please eat 3 meals and 3 snacks.  Include protein (meat, dairy-cheese, eggs, nuts) with all meals.  Be mindful that carbohydrates increase your blood sugar.  Not just sweet food (cookies, cake, donuts, fruit, juice, soda) but also bread, pasta, rice, and potatoes.  You have to limit how many carbs you are eating.  Adding exercise, as little as 30 minutes a day can decrease your blood sugar.  Gestational Diabetes Mellitus Gestational diabetes mellitus, often simply referred to as gestational diabetes, is a type of diabetes that some women develop during pregnancy. In gestational diabetes, the pancreas does not make enough insulin (a hormone), the cells are less responsive to the insulin that is made (insulin resistance), or both.Normally, insulin moves sugars from food into the tissue cells. The tissue cells use the sugars for energy. The lack of insulin or the lack of normal response to insulin causes excess sugars to build up in the blood instead of going into the tissue cells. As a result, high blood sugar (hyperglycemia) develops. The effect of high sugar (glucose) levels can cause many problems.  RISK FACTORS You have an increased chance of developing gestational diabetes if you have a family history of diabetes and also have one or more of the following risk factors:  A body mass index over 30 (obesity).  A previous pregnancy with gestational diabetes.  An older age at the time of pregnancy. If blood glucose levels are kept in  the normal range during pregnancy, women can have a healthy pregnancy. If your blood glucose levels are not well controlled, there may be risks to you, your unborn baby (fetus), your labor and delivery, or your newborn baby.  SYMPTOMS  If symptoms are experienced, they are much like symptoms you would normally expect during pregnancy. The symptoms of gestational diabetes include:   Increased thirst (polydipsia).  Increased urination (polyuria).  Increased urination during the night (nocturia).  Weight loss. This weight loss may be rapid.  Frequent, recurring infections.  Tiredness (fatigue).  Weakness.  Vision changes, such as blurred vision.  Fruity smell to your breath.  Abdominal pain. DIAGNOSIS Diabetes is diagnosed when blood glucose levels are increased. Your blood glucose level may be checked by one or more of the following blood tests:  A fasting blood glucose test. You will not be allowed to eat for at least 8 hours before a blood sample is taken.  A random blood glucose test. Your blood glucose is checked at any time of the day regardless of when you ate.  A hemoglobin A1c blood glucose test. A hemoglobin A1c test provides information about blood glucose control over the previous 3 months.  An oral glucose tolerance test (OGTT). Your blood glucose is measured after you have not eaten (fasted) for 1-3 hours and then after you drink a glucose-containing beverage. Since the hormones that cause insulin resistance are highest at about 24-28 weeks of a pregnancy, an OGTT is usually performed during that time. If   you have risk factors for gestational diabetes, your health care provider may test you for gestational diabetes earlier than 24 weeks of pregnancy. TREATMENT   You will need to take diabetes medicine or insulin daily to keep blood glucose levels in the desired range.  You will need to match insulin dosing with exercise and healthy food choices. The treatment goal is  to maintain the before-meal (preprandial), bedtime, and overnight blood glucose level at 60-99 mg/dL during pregnancy. The treatment goal is to further maintain peak after-meal blood sugar (postprandial glucose) level at 100-140 mg/dL. HOME CARE INSTRUCTIONS   Have your hemoglobin A1c level checked twice a year.  Perform daily blood glucose monitoring as directed by your health care provider. It is common to perform frequent blood glucose monitoring.  Monitor urine ketones when you are ill and as directed by your health care provider.  Take your diabetes medicine and insulin as directed by your health care provider to maintain your blood glucose level in the desired range.  Never run out of diabetes medicine or insulin. It is needed every day.  Adjust insulin based on your intake of carbohydrates. Carbohydrates can raise blood glucose levels but need to be included in your diet. Carbohydrates provide vitamins, minerals, and fiber which are an essential part of a healthy diet. Carbohydrates are found in fruits, vegetables, whole grains, dairy products, legumes, and foods containing added sugars.  Eat healthy foods. Alternate 3 meals with 3 snacks.  Maintain a healthy weight gain. The usual total expected weight gain varies according to your prepregnancy body mass index (BMI).  Carry a medical alert card or wear your medical alert jewelry.  Carry a 15-gram carbohydrate snack with you at all times to treat low blood glucose (hypoglycemia). Some examples of 15-gram carbohydrate snacks include:  Glucose tablets, 3 or 4.  Glucose gel, 15-gram tube.  Raisins, 2 tablespoons (24 g).  Jelly beans, 6.  Animal crackers, 8.  Fruit juice, regular soda, or low-fat milk, 4 ounces (120 mL).  Gummy treats, 9.  Recognize hypoglycemia. Hypoglycemia during pregnancy occurs with blood glucose levels of 60 mg/dL and below. The risk for hypoglycemia increases when fasting or skipping meals, during or  after intense exercise, and during sleep. Hypoglycemia symptoms can include:  Tremors or shakes.  Decreased ability to concentrate.  Sweating.  Increased heart rate.  Headache.  Dry mouth.  Hunger.  Irritability.  Anxiety.  Restless sleep.  Altered speech or coordination.  Confusion.  Treat hypoglycemia promptly. If you are alert and able to safely swallow, follow the 15:15 rule:  Take 15-20 grams of rapid-acting glucose or carbohydrate. Rapid-acting options include glucose gel, glucose tablets, or 4 ounces (120 mL) of fruit juice, regular soda, or low-fat milk.  Check your blood glucose level 15 minutes after taking the glucose.  Take 15-20 grams more of glucose if the repeat blood glucose level is still 70 mg/dL or below.  Eat a meal or snack within 1 hour once blood glucose levels return to normal.  Be alert to polyuria (excess urination) and polydipsia (excess thirst) which are early signs of hyperglycemia. An early awareness of hyperglycemia allows for prompt treatment. Treat hyperglycemia as directed by your health care provider.  Engage in at least 30 minutes of physical activity a day or as directed by your health care provider. Ten minutes of physical activity timed 30 minutes after each meal is encouraged to control postprandial blood glucose levels.  Adjust your insulin dosing and food intake as   needed if you start a new exercise or sport.  Follow your sick-day plan at any time you are unable to eat or drink as usual.  Avoid tobacco and alcohol use.  Keep all follow-up visits as directed by your health care provider.  Follow the advice of your health care provider regarding your prenatal and post-delivery (postpartum) appointments, meal planning, exercise, medicines, vitamins, blood tests, other medical tests, and physical activities.  Perform daily skin and foot care. Examine your skin and feet daily for cuts, bruises, redness, nail problems, bleeding,  blisters, or sores.  Brush your teeth and gums at least twice a day and floss at least once a day. Follow up with your dentist regularly.  Schedule an eye exam during the first trimester of your pregnancy or as directed by your health care provider.  Share your diabetes management plan with your workplace or school.  Stay up-to-date with immunizations.  Learn to manage stress.  Obtain ongoing diabetes education and support as needed.  Learn about and consider breastfeeding your baby.  You should have your blood sugar level checked 6-12 weeks after delivery. This is done with an oral glucose tolerance test (OGTT). SEEK MEDICAL CARE IF:   You are unable to eat food or drink fluids for more than 6 hours.  You have nausea and vomiting for more than 6 hours.  You have a blood glucose level of 200 mg/dL and you have ketones in your urine.  There is a change in mental status.  You develop vision problems.  You have a persistent headache.  You have upper abdominal pain or discomfort.  You develop an additional serious illness.  You have diarrhea for more than 6 hours.  You have been sick or have had a fever for a couple of days and are not getting better. SEEK IMMEDIATE MEDICAL CARE IF:   You have difficulty breathing.  You no longer feel the baby moving.  You are bleeding or have discharge from your vagina.  You start having premature contractions or labor. MAKE SURE YOU:  Understand these instructions.  Will watch your condition.  Will get help right away if you are not doing well or get worse. Document Released: 04/25/2000 Document Revised: 06/03/2013 Document Reviewed: 08/16/2011 [employer] Patient Information 2015 [employer]. This information is not intended to replace advice given to you by your health care provider. Make sure you discuss any questions you have with your health care provider.  Breastfeeding Deciding to breastfeed is one of the best choices  you can make for you and your baby. A change in hormones during pregnancy causes your breast tissue to grow and increases the number and size of your milk ducts. These hormones also allow proteins, sugars, and fats from your blood supply to make breast milk in your milk-producing glands. Hormones prevent breast milk from being released before your baby is born as well as prompt milk flow after birth. Once breastfeeding has begun, thoughts of your baby, as well as his or her sucking or crying, can stimulate the release of milk from your milk-producing glands.  BENEFITS OF BREASTFEEDING For Your Baby  Your first milk (colostrum) helps your baby's digestive system function better.   There are antibodies in your milk that help your baby fight off infections.   Your baby has a lower incidence of asthma, allergies, and sudden infant death syndrome.   The nutrients in breast milk are better for your baby than infant formulas and are designed uniquely for   your baby's needs.   Breast milk improves your baby's brain development.   Your baby is less likely to develop other conditions, such as childhood obesity, asthma, or type 2 diabetes mellitus.  For You   Breastfeeding helps to create a very special bond between you and your baby.   Breastfeeding is convenient. Breast milk is always available at the correct temperature and costs nothing.   Breastfeeding helps to burn calories and helps you lose the weight gained during pregnancy.   Breastfeeding makes your uterus contract to its prepregnancy size faster and slows bleeding (lochia) after you give birth.   Breastfeeding helps to lower your risk of developing type 2 diabetes mellitus, osteoporosis, and breast or ovarian cancer later in life. SIGNS THAT YOUR BABY IS HUNGRY Early Signs of Hunger  Increased alertness or activity.  Stretching.  Movement of the head from side to side.  Movement of the head and opening of the mouth when  the corner of the mouth or cheek is stroked (rooting).  Increased sucking sounds, smacking lips, cooing, sighing, or squeaking.  Hand-to-mouth movements.  Increased sucking of fingers or hands. Late Signs of Hunger  Fussing.  Intermittent crying. Extreme Signs of Hunger Signs of extreme hunger will require calming and consoling before your baby will be able to breastfeed successfully. Do not wait for the following signs of extreme hunger to occur before you initiate breastfeeding:   Restlessness.  A loud, strong cry.   Screaming. BREASTFEEDING BASICS Breastfeeding Initiation  Find a comfortable place to sit or lie down, with your neck and back well supported.  Place a pillow or rolled up blanket under your baby to bring him or her to the level of your breast (if you are seated). Nursing pillows are specially designed to help support your arms and your baby while you breastfeed.  Make sure that your baby's abdomen is facing your abdomen.   Gently massage your breast. With your fingertips, massage from your chest wall toward your nipple in a circular motion. This encourages milk flow. You may need to continue this action during the feeding if your milk flows slowly.  Support your breast with 4 fingers underneath and your thumb above your nipple. Make sure your fingers are well away from your nipple and your baby's mouth.   Stroke your baby's lips gently with your finger or nipple.   When your baby's mouth is open wide enough, quickly bring your baby to your breast, placing your entire nipple and as much of the colored area around your nipple (areola) as possible into your baby's mouth.   More areola should be visible above your baby's upper lip than below the lower lip.   Your baby's tongue should be between his or her lower gum and your breast.   Ensure that your baby's mouth is correctly positioned around your nipple (latched). Your baby's lips should create a seal on  your breast and be turned out (everted).  It is common for your baby to suck about 2-3 minutes in order to start the flow of breast milk. Latching Teaching your baby how to latch on to your breast properly is very important. An improper latch can cause nipple pain and decreased milk supply for you and poor weight gain in your baby. Also, if your baby is not latched onto your nipple properly, he or she may swallow some air during feeding. This can make your baby fussy. Burping your baby when you switch breasts during the feeding   can help to get rid of the air. However, teaching your baby to latch on properly is still the best way to prevent fussiness from swallowing air while breastfeeding. Signs that your baby has successfully latched on to your nipple:    Silent tugging or silent sucking, without causing you pain.   Swallowing heard between every 3-4 sucks.    Muscle movement above and in front of his or her ears while sucking.  Signs that your baby has not successfully latched on to nipple:   Sucking sounds or smacking sounds from your baby while breastfeeding.  Nipple pain. If you think your baby has not latched on correctly, slip your finger into the corner of your baby's mouth to break the suction and place it between your baby's gums. Attempt breastfeeding initiation again. Signs of Successful Breastfeeding Signs from your baby:   A gradual decrease in the number of sucks or complete cessation of sucking.   Falling asleep.   Relaxation of his or her body.   Retention of a small amount of milk in his or her mouth.   Letting go of your breast by himself or herself. Signs from you:  Breasts that have increased in firmness, weight, and size 1-3 hours after feeding.   Breasts that are softer immediately after breastfeeding.  Increased milk volume, as well as a change in milk consistency and color by the fifth day of breastfeeding.   Nipples that are not sore,  cracked, or bleeding. Signs That Your Baby is Getting Enough Milk  Wetting at least 3 diapers in a 24-hour period. The urine should be clear and pale yellow by age 5 days.  At least 3 stools in a 24-hour period by age 5 days. The stool should be soft and yellow.  At least 3 stools in a 24-hour period by age 7 days. The stool should be seedy and yellow.  No loss of weight greater than 10% of birth weight during the first 3 days of age.  Average weight gain of 4-7 ounces (113-198 g) per week after age 4 days.  Consistent daily weight gain by age 5 days, without weight loss after the age of 2 weeks. After a feeding, your baby may spit up a small amount. This is common. BREASTFEEDING FREQUENCY AND DURATION Frequent feeding will help you make more milk and can prevent sore nipples and breast engorgement. Breastfeed when you feel the need to reduce the fullness of your breasts or when your baby shows signs of hunger. This is called "breastfeeding on demand." Avoid introducing a pacifier to your baby while you are working to establish breastfeeding (the first 4-6 weeks after your baby is born). After this time you may choose to use a pacifier. Research has shown that pacifier use during the first year of a baby's life decreases the risk of sudden infant death syndrome (SIDS). Allow your baby to feed on each breast as long as he or she wants. Breastfeed until your baby is finished feeding. When your baby unlatches or falls asleep while feeding from the first breast, offer the second breast. Because newborns are often sleepy in the first few weeks of life, you may need to awaken your baby to get him or her to feed. Breastfeeding times will vary from baby to baby. However, the following rules can serve as a guide to help you ensure that your baby is properly fed:  Newborns (babies 4 weeks of age or younger) may breastfeed every 1-3 hours.    Newborns should not go longer than 3 hours during the day or 5  hours during the night without breastfeeding.  You should breastfeed your baby a minimum of 8 times in a 24-hour period until you begin to introduce solid foods to your baby at around 6 months of age. BREAST MILK PUMPING Pumping and storing breast milk allows you to ensure that your baby is exclusively fed your breast milk, even at times when you are unable to breastfeed. This is especially important if you are going back to work while you are still breastfeeding or when you are not able to be present during feedings. Your lactation consultant can give you guidelines on how long it is safe to store breast milk.  A breast pump is a machine that allows you to pump milk from your breast into a sterile bottle. The pumped breast milk can then be stored in a refrigerator or freezer. Some breast pumps are operated by hand, while others use electricity. Ask your lactation consultant which type will work best for you. Breast pumps can be purchased, but some hospitals and breastfeeding support groups lease breast pumps on a monthly basis. A lactation consultant can teach you how to hand express breast milk, if you prefer not to use a pump.  CARING FOR YOUR BREASTS WHILE YOU BREASTFEED Nipples can become dry, cracked, and sore while breastfeeding. The following recommendations can help keep your breasts moisturized and healthy:  Avoid using soap on your nipples.   Wear a supportive bra. Although not required, special nursing bras and tank tops are designed to allow access to your breasts for breastfeeding without taking off your entire bra or top. Avoid wearing underwire-style bras or extremely tight bras.  Air dry your nipples for 3-4minutes after each feeding.   Use only cotton bra pads to absorb leaked breast milk. Leaking of breast milk between feedings is normal.   Use lanolin on your nipples after breastfeeding. Lanolin helps to maintain your skin's normal moisture barrier. If you use pure lanolin,  you do not need to wash it off before feeding your baby again. Pure lanolin is not toxic to your baby. You may also hand express a few drops of breast milk and gently massage that milk into your nipples and allow the milk to air dry. In the first few weeks after giving birth, some women experience extremely full breasts (engorgement). Engorgement can make your breasts feel heavy, warm, and tender to the touch. Engorgement peaks within 3-5 days after you give birth. The following recommendations can help ease engorgement:  Completely empty your breasts while breastfeeding or pumping. You may want to start by applying warm, moist heat (in the shower or with warm water-soaked hand towels) just before feeding or pumping. This increases circulation and helps the milk flow. If your baby does not completely empty your breasts while breastfeeding, pump any extra milk after he or she is finished.  Wear a snug bra (nursing or regular) or tank top for 1-2 days to signal your body to slightly decrease milk production.  Apply ice packs to your breasts, unless this is too uncomfortable for you.  Make sure that your baby is latched on and positioned properly while breastfeeding. If engorgement persists after 48 hours of following these recommendations, contact your health care provider or a lactation consultant. OVERALL HEALTH CARE RECOMMENDATIONS WHILE BREASTFEEDING  Eat healthy foods. Alternate between meals and snacks, eating 3 of each per day. Because what you eat affects your breast   milk, some of the foods may make your baby more irritable than usual. Avoid eating these foods if you are sure that they are negatively affecting your baby.  Drink milk, fruit juice, and water to satisfy your thirst (about 10 glasses a day).   Rest often, relax, and continue to take your prenatal vitamins to prevent fatigue, stress, and anemia.  Continue breast self-awareness checks.  Avoid chewing and smoking  tobacco.  Avoid alcohol and drug use. Some medicines that may be harmful to your baby can pass through breast milk. It is important to ask your health care provider before taking any medicine, including all over-the-counter and prescription medicine as well as vitamin and herbal supplements. It is possible to become pregnant while breastfeeding. If birth control is desired, ask your health care provider about options that will be safe for your baby. SEEK MEDICAL CARE IF:   You feel like you want to stop breastfeeding or have become frustrated with breastfeeding.  You have painful breasts or nipples.  Your nipples are cracked or bleeding.  Your breasts are red, tender, or warm.  You have a swollen area on either breast.  You have a fever or chills.  You have nausea or vomiting.  You have drainage other than breast milk from your nipples.  Your breasts do not become full before feedings by the fifth day after you give birth.  You feel sad and depressed.  Your baby is too sleepy to eat well.  Your baby is having trouble sleeping.   Your baby is wetting less than 3 diapers in a 24-hour period.  Your baby has less than 3 stools in a 24-hour period.  Your baby's skin or the white part of his or her eyes becomes yellow.   Your baby is not gaining weight by 5 days of age. SEEK IMMEDIATE MEDICAL CARE IF:   Your baby is overly tired (lethargic) and does not want to wake up and feed.  Your baby develops an unexplained fever. Document Released: 01/17/2005 Document Revised: 01/22/2013 Document Reviewed: 07/11/2012 [employer] Patient Information 2015 [employer]. This information is not intended to replace advice given to you by your health care provider. Make sure you discuss any questions you have with your health care provider.  

## 2013-08-26 NOTE — Progress Notes (Signed)
Patient escorted to see me once checked in this morning.  Patient had discovered on 08/22/13 an error in her chart.  I had documents ready for patient to sign to request amendment of chart.  Patient refused to sign.  Patient states she took medication on 08/20/13 for + chlamydia which she doesn't have.  Patient concerned that she took antibiotic unnecessarily and it might be harmful to her baby.  Explained this antibiotic is safe in pregnancy.  Patient expresses concern as this is the 3rd antibiotic she has taken in 2 weeks - one for her "underarm" and another for her "tooth".  Explained to patient this should not be a problem as it is safe in pregnancy.  Offered sincere apology for this error.  Explained to patient that her chart will be corrected.  Father of baby was present for discussion and states he wants "the doctor fired today."  States "if anything happens to my unborn baby I will sue this hospital."  Patient states she and the father of the baby argued all day over these results and her baby almost didn't have its father.  States father of baby was preparing to go to his primary care physician when she called our office and inquired about her results.  Patient was very upset that no one contacted her about the error and that she had to contact us on Thursday 08/22/13.  Patient states if she could go anywhere else to have her baby, she wouldn't be here.  Sincerely apologized again for the error and explained her chart would be corrected.  Patient and father of baby then left the office to wait for their appointment.

## 2013-08-26 NOTE — Progress Notes (Signed)
No book today Notes that her BS are elevated after lunch Does not want to begin insulin Reports that dinners are good and breakfasts are good Advised about diet and how medication is working, but ways to decrease carbs with lunch, including different salad dressings and decreasing bread intake. U/s growth 7/23 1211 gms (55%), nml fluid, cephalic

## 2013-08-27 ENCOUNTER — Encounter: Payer: Self-pay | Admitting: *Deleted

## 2013-09-02 ENCOUNTER — Ambulatory Visit (INDEPENDENT_AMBULATORY_CARE_PROVIDER_SITE_OTHER): Payer: Medicaid Other | Admitting: Family Medicine

## 2013-09-02 VITALS — BP 110/72 | HR 95 | Temp 98.2°F | Wt 280.2 lb

## 2013-09-02 DIAGNOSIS — O10019 Pre-existing essential hypertension complicating pregnancy, unspecified trimester: Secondary | ICD-10-CM

## 2013-09-02 DIAGNOSIS — O10913 Unspecified pre-existing hypertension complicating pregnancy, third trimester: Secondary | ICD-10-CM

## 2013-09-02 DIAGNOSIS — O24313 Unspecified pre-existing diabetes mellitus in pregnancy, third trimester: Secondary | ICD-10-CM

## 2013-09-02 DIAGNOSIS — E119 Type 2 diabetes mellitus without complications: Secondary | ICD-10-CM

## 2013-09-02 DIAGNOSIS — O24919 Unspecified diabetes mellitus in pregnancy, unspecified trimester: Secondary | ICD-10-CM

## 2013-09-02 LAB — POCT URINALYSIS DIP (DEVICE)
GLUCOSE, UA: NEGATIVE mg/dL
Ketones, ur: 80 mg/dL — AB
NITRITE: NEGATIVE
Protein, ur: 100 mg/dL — AB
SPECIFIC GRAVITY, URINE: 1.02 (ref 1.005–1.030)
Urobilinogen, UA: 0.2 mg/dL (ref 0.0–1.0)
pH: 6 (ref 5.0–8.0)

## 2013-09-02 MED ORDER — METFORMIN HCL 500 MG PO TABS: 500.0000 mg | ORAL_TABLET | Freq: Two times a day (BID) | ORAL | Status: DC

## 2013-09-02 NOTE — Patient Instructions (Signed)
Third Trimester of Pregnancy The third trimester is from week 29 through week 42, months 7 through 9. The third trimester is a time when the fetus is growing rapidly. At the end of the ninth month, the fetus is about 20 inches in length and weighs 6-10 pounds.  BODY CHANGES Your body goes through many changes during pregnancy. The changes vary from woman to woman.   Your weight will continue to increase. You can expect to gain 25-35 pounds (11-16 kg) by the end of the pregnancy.  You may begin to get stretch marks on your hips, abdomen, and breasts.  You may urinate more often because the fetus is moving lower into your pelvis and pressing on your bladder.  You may develop or continue to have heartburn as a result of your pregnancy.  You may develop constipation because certain hormones are causing the muscles that push waste through your intestines to slow down.  You may develop hemorrhoids or swollen, bulging veins (varicose veins).  You may have pelvic pain because of the weight gain and pregnancy hormones relaxing your joints between the bones in your pelvis. Backaches may result from overexertion of the muscles supporting your posture.  You may have changes in your hair. These can include thickening of your hair, rapid growth, and changes in texture. Some women also have hair loss during or after pregnancy, or hair that feels dry or thin. Your hair will most likely return to normal after your baby is born.  Your breasts will continue to grow and be tender. A yellow discharge may leak from your breasts called colostrum.  Your belly button may stick out.  You may feel short of breath because of your expanding uterus.  You may notice the fetus "dropping," or moving lower in your abdomen.  You may have a bloody mucus discharge. This usually occurs a few days to a week before labor begins.  Your cervix becomes thin and soft (effaced) near your due date. WHAT TO EXPECT AT YOUR  PRENATAL EXAMS  You will have prenatal exams every 2 weeks until week 36. Then, you will have weekly prenatal exams. During a routine prenatal visit:  You will be weighed to make sure you and the fetus are growing normally.  Your blood pressure is taken.  Your abdomen will be measured to track your baby's growth.  The fetal heartbeat will be listened to.  Any test results from the previous visit will be discussed.  You may have a cervical check near your due date to see if you have effaced. At around 36 weeks, your caregiver will check your cervix. At the same time, your caregiver will also perform a test on the secretions of the vaginal tissue. This test is to determine if a type of bacteria, Group B streptococcus, is present. Your caregiver will explain this further. Your caregiver may ask you:  What your birth plan is.  How you are feeling.  If you are feeling the baby move.  If you have had any abnormal symptoms, such as leaking fluid, bleeding, severe headaches, or abdominal cramping.  If you have any questions. Other tests or screenings that may be performed during your third trimester include:  Blood tests that check for low iron levels (anemia).  Fetal testing to check the health, activity level, and growth of the fetus. Testing is done if you have certain medical conditions or if there are problems during the pregnancy. FALSE LABOR You may feel small, irregular contractions that   eventually go away. These are called Braxton Hicks contractions, or false labor. Contractions may last for hours, days, or even weeks before true labor sets in. If contractions come at regular intervals, intensify, or become painful, it is best to be seen by your caregiver.  SIGNS OF LABOR   Menstrual-like cramps.  Contractions that are 5 minutes apart or less.  Contractions that start on the top of the uterus and spread down to the lower abdomen and back.  A sense of increased pelvic  pressure or back pain.  A watery or bloody mucus discharge that comes from the vagina. If you have any of these signs before the 37th week of pregnancy, call your caregiver right away. You need to go to the hospital to get checked immediately. HOME CARE INSTRUCTIONS   Avoid all smoking, herbs, alcohol, and unprescribed drugs. These chemicals affect the formation and growth of the baby.  Follow your caregiver's instructions regarding medicine use. There are medicines that are either safe or unsafe to take during pregnancy.  Exercise only as directed by your caregiver. Experiencing uterine cramps is a good sign to stop exercising.  Continue to eat regular, healthy meals.  Wear a good support bra for breast tenderness.  Do not use hot tubs, steam rooms, or saunas.  Wear your seat belt at all times when driving.  Avoid raw meat, uncooked cheese, cat litter boxes, and soil used by cats. These carry germs that can cause birth defects in the baby.  Take your prenatal vitamins.  Try taking a stool softener (if your caregiver approves) if you develop constipation. Eat more high-fiber foods, such as fresh vegetables or fruit and whole grains. Drink plenty of fluids to keep your urine clear or pale yellow.  Take warm sitz baths to soothe any pain or discomfort caused by hemorrhoids. Use hemorrhoid cream if your caregiver approves.  If you develop varicose veins, wear support hose. Elevate your feet for 15 minutes, 3-4 times a day. Limit salt in your diet.  Avoid heavy lifting, wear low heal shoes, and practice good posture.  Rest a lot with your legs elevated if you have leg cramps or low back pain.  Visit your dentist if you have not gone during your pregnancy. Use a soft toothbrush to brush your teeth and be gentle when you floss.  A sexual relationship may be continued unless your caregiver directs you otherwise.  Do not travel far distances unless it is absolutely necessary and only  with the approval of your caregiver.  Take prenatal classes to understand, practice, and ask questions about the labor and delivery.  Make a trial run to the hospital.  Pack your hospital bag.  Prepare the baby's nursery.  Continue to go to all your prenatal visits as directed by your caregiver. SEEK MEDICAL CARE IF:  You are unsure if you are in labor or if your water has broken.  You have dizziness.  You have mild pelvic cramps, pelvic pressure, or nagging pain in your abdominal area.  You have persistent nausea, vomiting, or diarrhea.  You have a bad smelling vaginal discharge.  You have pain with urination. SEEK IMMEDIATE MEDICAL CARE IF:   You have a fever.  You are leaking fluid from your vagina.  You have spotting or bleeding from your vagina.  You have severe abdominal cramping or pain.  You have rapid weight loss or gain.  You have shortness of breath with chest pain.  You notice sudden or extreme swelling   of your face, hands, ankles, feet, or legs.  You have not felt your baby move in over an hour.  You have severe headaches that do not go away with medicine.  You have vision changes. Document Released: 01/11/2001 Document Revised: 01/22/2013 Document Reviewed: 03/20/2012 [employer] Patient Information 2015 [employer]. This information is not intended to replace advice given to you by your health care provider. Make sure you discuss any questions you have with your health care provider.  Breastfeeding Deciding to breastfeed is one of the best choices you can make for you and your baby. A change in hormones during pregnancy causes your breast tissue to grow and increases the number and size of your milk ducts. These hormones also allow proteins, sugars, and fats from your blood supply to make breast milk in your milk-producing glands. Hormones prevent breast milk from being released before your baby is born as well as prompt milk flow after birth. Once  breastfeeding has begun, thoughts of your baby, as well as his or her sucking or crying, can stimulate the release of milk from your milk-producing glands.  BENEFITS OF BREASTFEEDING For Your Baby  Your first milk (colostrum) helps your baby's digestive system function better.   There are antibodies in your milk that help your baby fight off infections.   Your baby has a lower incidence of asthma, allergies, and sudden infant death syndrome.   The nutrients in breast milk are better for your baby than infant formulas and are designed uniquely for your baby's needs.   Breast milk improves your baby's brain development.   Your baby is less likely to develop other conditions, such as childhood obesity, asthma, or type 2 diabetes mellitus.  For You   Breastfeeding helps to create a very special bond between you and your baby.   Breastfeeding is convenient. Breast milk is always available at the correct temperature and costs nothing.   Breastfeeding helps to burn calories and helps you lose the weight gained during pregnancy.   Breastfeeding makes your uterus contract to its prepregnancy size faster and slows bleeding (lochia) after you give birth.   Breastfeeding helps to lower your risk of developing type 2 diabetes mellitus, osteoporosis, and breast or ovarian cancer later in life. SIGNS THAT YOUR BABY IS HUNGRY Early Signs of Hunger  Increased alertness or activity.  Stretching.  Movement of the head from side to side.  Movement of the head and opening of the mouth when the corner of the mouth or cheek is stroked (rooting).  Increased sucking sounds, smacking lips, cooing, sighing, or squeaking.  Hand-to-mouth movements.  Increased sucking of fingers or hands. Late Signs of Hunger  Fussing.  Intermittent crying. Extreme Signs of Hunger Signs of extreme hunger will require calming and consoling before your baby will be able to breastfeed successfully. Do not  wait for the following signs of extreme hunger to occur before you initiate breastfeeding:   Restlessness.  A loud, strong cry.   Screaming. BREASTFEEDING BASICS Breastfeeding Initiation  Find a comfortable place to sit or lie down, with your neck and back well supported.  Place a pillow or rolled up blanket under your baby to bring him or her to the level of your breast (if you are seated). Nursing pillows are specially designed to help support your arms and your baby while you breastfeed.  Make sure that your baby's abdomen is facing your abdomen.   Gently massage your breast. With your fingertips, massage from your chest   wall toward your nipple in a circular motion. This encourages milk flow. You may need to continue this action during the feeding if your milk flows slowly.  Support your breast with 4 fingers underneath and your thumb above your nipple. Make sure your fingers are well away from your nipple and your baby's mouth.   Stroke your baby's lips gently with your finger or nipple.   When your baby's mouth is open wide enough, quickly bring your baby to your breast, placing your entire nipple and as much of the colored area around your nipple (areola) as possible into your baby's mouth.   More areola should be visible above your baby's upper lip than below the lower lip.   Your baby's tongue should be between his or her lower gum and your breast.   Ensure that your baby's mouth is correctly positioned around your nipple (latched). Your baby's lips should create a seal on your breast and be turned out (everted).  It is common for your baby to suck about 2-3 minutes in order to start the flow of breast milk. Latching Teaching your baby how to latch on to your breast properly is very important. An improper latch can cause nipple pain and decreased milk supply for you and poor weight gain in your baby. Also, if your baby is not latched onto your nipple properly, he or she  may swallow some air during feeding. This can make your baby fussy. Burping your baby when you switch breasts during the feeding can help to get rid of the air. However, teaching your baby to latch on properly is still the best way to prevent fussiness from swallowing air while breastfeeding. Signs that your baby has successfully latched on to your nipple:    Silent tugging or silent sucking, without causing you pain.   Swallowing heard between every 3-4 sucks.    Muscle movement above and in front of his or her ears while sucking.  Signs that your baby has not successfully latched on to nipple:   Sucking sounds or smacking sounds from your baby while breastfeeding.  Nipple pain. If you think your baby has not latched on correctly, slip your finger into the corner of your baby's mouth to break the suction and place it between your baby's gums. Attempt breastfeeding initiation again. Signs of Successful Breastfeeding Signs from your baby:   A gradual decrease in the number of sucks or complete cessation of sucking.   Falling asleep.   Relaxation of his or her body.   Retention of a small amount of milk in his or her mouth.   Letting go of your breast by himself or herself. Signs from you:  Breasts that have increased in firmness, weight, and size 1-3 hours after feeding.   Breasts that are softer immediately after breastfeeding.  Increased milk volume, as well as a change in milk consistency and color by the fifth day of breastfeeding.   Nipples that are not sore, cracked, or bleeding. Signs That Your Baby is Getting Enough Milk  Wetting at least 3 diapers in a 24-hour period. The urine should be clear and pale yellow by age 5 days.  At least 3 stools in a 24-hour period by age 5 days. The stool should be soft and yellow.  At least 3 stools in a 24-hour period by age 7 days. The stool should be seedy and yellow.  No loss of weight greater than 10% of birth weight  during the first 3   days of age.  Average weight gain of 4-7 ounces (113-198 g) per week after age 4 days.  Consistent daily weight gain by age 5 days, without weight loss after the age of 2 weeks. After a feeding, your baby may spit up a small amount. This is common. BREASTFEEDING FREQUENCY AND DURATION Frequent feeding will help you make more milk and can prevent sore nipples and breast engorgement. Breastfeed when you feel the need to reduce the fullness of your breasts or when your baby shows signs of hunger. This is called "breastfeeding on demand." Avoid introducing a pacifier to your baby while you are working to establish breastfeeding (the first 4-6 weeks after your baby is born). After this time you may choose to use a pacifier. Research has shown that pacifier use during the first year of a baby's life decreases the risk of sudden infant death syndrome (SIDS). Allow your baby to feed on each breast as long as he or she wants. Breastfeed until your baby is finished feeding. When your baby unlatches or falls asleep while feeding from the first breast, offer the second breast. Because newborns are often sleepy in the first few weeks of life, you may need to awaken your baby to get him or her to feed. Breastfeeding times will vary from baby to baby. However, the following rules can serve as a guide to help you ensure that your baby is properly fed:  Newborns (babies 4 weeks of age or younger) may breastfeed every 1-3 hours.  Newborns should not go longer than 3 hours during the day or 5 hours during the night without breastfeeding.  You should breastfeed your baby a minimum of 8 times in a 24-hour period until you begin to introduce solid foods to your baby at around 6 months of age. BREAST MILK PUMPING Pumping and storing breast milk allows you to ensure that your baby is exclusively fed your breast milk, even at times when you are unable to breastfeed. This is especially important if you are  going back to work while you are still breastfeeding or when you are not able to be present during feedings. Your lactation consultant can give you guidelines on how long it is safe to store breast milk.  A breast pump is a machine that allows you to pump milk from your breast into a sterile bottle. The pumped breast milk can then be stored in a refrigerator or freezer. Some breast pumps are operated by hand, while others use electricity. Ask your lactation consultant which type will work best for you. Breast pumps can be purchased, but some hospitals and breastfeeding support groups lease breast pumps on a monthly basis. A lactation consultant can teach you how to hand express breast milk, if you prefer not to use a pump.  CARING FOR YOUR BREASTS WHILE YOU BREASTFEED Nipples can become dry, cracked, and sore while breastfeeding. The following recommendations can help keep your breasts moisturized and healthy:  Avoid using soap on your nipples.   Wear a supportive bra. Although not required, special nursing bras and tank tops are designed to allow access to your breasts for breastfeeding without taking off your entire bra or top. Avoid wearing underwire-style bras or extremely tight bras.  Air dry your nipples for 3-4minutes after each feeding.   Use only cotton bra pads to absorb leaked breast milk. Leaking of breast milk between feedings is normal.   Use lanolin on your nipples after breastfeeding. Lanolin helps to maintain your skin's   normal moisture barrier. If you use pure lanolin, you do not need to wash it off before feeding your baby again. Pure lanolin is not toxic to your baby. You may also hand express a few drops of breast milk and gently massage that milk into your nipples and allow the milk to air dry. In the first few weeks after giving birth, some women experience extremely full breasts (engorgement). Engorgement can make your breasts feel heavy, warm, and tender to the touch.  Engorgement peaks within 3-5 days after you give birth. The following recommendations can help ease engorgement:  Completely empty your breasts while breastfeeding or pumping. You may want to start by applying warm, moist heat (in the shower or with warm water-soaked hand towels) just before feeding or pumping. This increases circulation and helps the milk flow. If your baby does not completely empty your breasts while breastfeeding, pump any extra milk after he or she is finished.  Wear a snug bra (nursing or regular) or tank top for 1-2 days to signal your body to slightly decrease milk production.  Apply ice packs to your breasts, unless this is too uncomfortable for you.  Make sure that your baby is latched on and positioned properly while breastfeeding. If engorgement persists after 48 hours of following these recommendations, contact your health care provider or a lactation consultant. OVERALL HEALTH CARE RECOMMENDATIONS WHILE BREASTFEEDING  Eat healthy foods. Alternate between meals and snacks, eating 3 of each per day. Because what you eat affects your breast milk, some of the foods may make your baby more irritable than usual. Avoid eating these foods if you are sure that they are negatively affecting your baby.  Drink milk, fruit juice, and water to satisfy your thirst (about 10 glasses a day).   Rest often, relax, and continue to take your prenatal vitamins to prevent fatigue, stress, and anemia.  Continue breast self-awareness checks.  Avoid chewing and smoking tobacco.  Avoid alcohol and drug use. Some medicines that may be harmful to your baby can pass through breast milk. It is important to ask your health care provider before taking any medicine, including all over-the-counter and prescription medicine as well as vitamin and herbal supplements. It is possible to become pregnant while breastfeeding. If birth control is desired, ask your health care provider about options that  will be safe for your baby. SEEK MEDICAL CARE IF:   You feel like you want to stop breastfeeding or have become frustrated with breastfeeding.  You have painful breasts or nipples.  Your nipples are cracked or bleeding.  Your breasts are red, tender, or warm.  You have a swollen area on either breast.  You have a fever or chills.  You have nausea or vomiting.  You have drainage other than breast milk from your nipples.  Your breasts do not become full before feedings by the fifth day after you give birth.  You feel sad and depressed.  Your baby is too sleepy to eat well.  Your baby is having trouble sleeping.   Your baby is wetting less than 3 diapers in a 24-hour period.  Your baby has less than 3 stools in a 24-hour period.  Your baby's skin or the white part of his or her eyes becomes yellow.   Your baby is not gaining weight by 5 days of age. SEEK IMMEDIATE MEDICAL CARE IF:   Your baby is overly tired (lethargic) and does not want to wake up and feed.  Your baby   develops an unexplained fever. Document Released: 01/17/2005 Document Revised: 01/22/2013 Document Reviewed: 07/11/2012 [employer] Patient Information 2015 [employer]. This information is not intended to replace advice given to you by your health care provider. Make sure you discuss any questions you have with your health care provider.  

## 2013-09-02 NOTE — Progress Notes (Signed)
C/o arm still hurts after tetanus shot 2 weeks ago.

## 2013-09-02 NOTE — Progress Notes (Signed)
This patient was seen today at 11:46 am. Urine is dirty today--will check culture FBS 62-112 (3 of 7 are out of range) 2hr pp 88-147 (8 of 17 are out of range No book last time BS remain high--begin glucophage 500 mg bid--pt. Is still reluctant to start insulin

## 2013-09-05 ENCOUNTER — Telehealth: Payer: Self-pay | Admitting: *Deleted

## 2013-09-05 DIAGNOSIS — N390 Urinary tract infection, site not specified: Secondary | ICD-10-CM

## 2013-09-05 MED ORDER — CEPHALEXIN 500 MG PO CAPS: 500.0000 mg | ORAL_CAPSULE | Freq: Three times a day (TID) | ORAL | Status: DC

## 2013-09-05 NOTE — Telephone Encounter (Signed)
Attempted to contact patient, no answer, left message for patient that we are attempting to contact her to give her results, to please call the clinic.  Medication ordered per Dr. Shawnie PonsPratt.

## 2013-09-05 NOTE — Telephone Encounter (Signed)
Message copied by Dorothyann PengHAIZLIP, Elmor Kost E on Thu Sep 05, 2013  8:46 AM ------      Message from: Reva BoresPRATT, TANYA S      Created: Thu Sep 05, 2013  8:17 AM       She has a UTI--she will be suspicious--needs keflex 500 mg tid x 7 days--it is safe for baby! ------

## 2013-09-06 LAB — CULTURE, OB URINE

## 2013-09-06 NOTE — Telephone Encounter (Signed)
Contacted patient, informed of results and note by Dr. Shawnie PonsPratt that it is safe for the baby.  Lab verified.  Pt verbalized understanding.

## 2013-09-09 ENCOUNTER — Encounter: Payer: Medicaid Other | Admitting: Obstetrics & Gynecology

## 2013-09-10 ENCOUNTER — Telehealth: Payer: Self-pay | Admitting: *Deleted

## 2013-09-10 ENCOUNTER — Ambulatory Visit (INDEPENDENT_AMBULATORY_CARE_PROVIDER_SITE_OTHER): Payer: Medicaid Other | Admitting: Obstetrics and Gynecology

## 2013-09-10 ENCOUNTER — Encounter: Payer: Self-pay | Admitting: Obstetrics and Gynecology

## 2013-09-10 VITALS — BP 129/80 | HR 104 | Temp 98.6°F | Wt 280.8 lb

## 2013-09-10 DIAGNOSIS — O10913 Unspecified pre-existing hypertension complicating pregnancy, third trimester: Secondary | ICD-10-CM

## 2013-09-10 DIAGNOSIS — O09519 Supervision of elderly primigravida, unspecified trimester: Secondary | ICD-10-CM

## 2013-09-10 DIAGNOSIS — O24919 Unspecified diabetes mellitus in pregnancy, unspecified trimester: Secondary | ICD-10-CM

## 2013-09-10 DIAGNOSIS — O10019 Pre-existing essential hypertension complicating pregnancy, unspecified trimester: Secondary | ICD-10-CM

## 2013-09-10 DIAGNOSIS — E669 Obesity, unspecified: Secondary | ICD-10-CM

## 2013-09-10 DIAGNOSIS — O99213 Obesity complicating pregnancy, third trimester: Secondary | ICD-10-CM

## 2013-09-10 DIAGNOSIS — O24313 Unspecified pre-existing diabetes mellitus in pregnancy, third trimester: Secondary | ICD-10-CM

## 2013-09-10 DIAGNOSIS — E119 Type 2 diabetes mellitus without complications: Secondary | ICD-10-CM

## 2013-09-10 DIAGNOSIS — O09513 Supervision of elderly primigravida, third trimester: Secondary | ICD-10-CM

## 2013-09-10 DIAGNOSIS — O99210 Obesity complicating pregnancy, unspecified trimester: Secondary | ICD-10-CM

## 2013-09-10 LAB — POCT URINALYSIS DIP (DEVICE)
GLUCOSE, UA: NEGATIVE mg/dL
HGB URINE DIPSTICK: NEGATIVE
Ketones, ur: 80 mg/dL — AB
LEUKOCYTES UA: NEGATIVE
NITRITE: NEGATIVE
Protein, ur: NEGATIVE mg/dL
Specific Gravity, Urine: 1.02 (ref 1.005–1.030)
UROBILINOGEN UA: 0.2 mg/dL (ref 0.0–1.0)
pH: 6.5 (ref 5.0–8.0)

## 2013-09-10 NOTE — Telephone Encounter (Signed)
Called pt regarding her future clinic appts. I explained that she will not need to begin NST testing until next week, therefore her appt for 8/13 is not needed. I also asked if pt would be willing to change her appt time on 8/18 to 0800 and she agreed. Pt has appt @ [facility] on 8/20 for US and I advised pt that I can arrange for her NST to be done there as well so that she will not have to be seen in our clinic that Jasily[facility] Holderman.  Pt voiced understanding and was agreeable to all suggestions made regarding these appt changes. I called [facility] dept and added NST on 8/20 @ 1315. Pt will be notified per her request at the time of her next clinic appt on 8/18.

## 2013-09-10 NOTE — Progress Notes (Signed)
Patient is doing well without complaints. [facility] FBS 64-100 (1/7 abnormal) 2hr p Bk 86-141 (1/7 abnormal) 2hr pL 93-138 (1/7 abnormal) 2hr p D 111-142 (3/7 abnormal)  Patient admits to overeating at times and deviating from diet. Patient did not start glucophage. She opted to change her diet instead. Informed patient that despite her best efforts it is at times necessary to add oral agents or switch to insulin. Congratulated patient on her efforts. Start twice weekly testing at next visit

## 2013-09-12 ENCOUNTER — Other Ambulatory Visit: Payer: Medicaid Other

## 2013-09-17 ENCOUNTER — Ambulatory Visit (INDEPENDENT_AMBULATORY_CARE_PROVIDER_SITE_OTHER): Payer: Medicaid Other | Admitting: Obstetrics and Gynecology

## 2013-09-17 ENCOUNTER — Encounter: Payer: Self-pay | Admitting: Obstetrics and Gynecology

## 2013-09-17 VITALS — BP 120/74 | HR 106 | Wt 276.3 lb

## 2013-09-17 DIAGNOSIS — O10913 Unspecified pre-existing hypertension complicating pregnancy, third trimester: Secondary | ICD-10-CM

## 2013-09-17 DIAGNOSIS — E119 Type 2 diabetes mellitus without complications: Secondary | ICD-10-CM

## 2013-09-17 DIAGNOSIS — O24919 Unspecified diabetes mellitus in pregnancy, unspecified trimester: Secondary | ICD-10-CM

## 2013-09-17 DIAGNOSIS — O09513 Supervision of elderly primigravida, third trimester: Secondary | ICD-10-CM

## 2013-09-17 DIAGNOSIS — O10019 Pre-existing essential hypertension complicating pregnancy, unspecified trimester: Secondary | ICD-10-CM

## 2013-09-17 DIAGNOSIS — O09519 Supervision of elderly primigravida, unspecified trimester: Secondary | ICD-10-CM

## 2013-09-17 DIAGNOSIS — O24313 Unspecified pre-existing diabetes mellitus in pregnancy, third trimester: Secondary | ICD-10-CM

## 2013-09-17 LAB — POCT URINALYSIS DIP (DEVICE)
Glucose, UA: NEGATIVE mg/dL
Hgb urine dipstick: NEGATIVE
KETONES UR: 40 mg/dL — AB
NITRITE: NEGATIVE
PH: 6 (ref 5.0–8.0)
Protein, ur: 30 mg/dL — AB
Specific Gravity, Urine: 1.025 (ref 1.005–1.030)
Urobilinogen, UA: 0.2 mg/dL (ref 0.0–1.0)

## 2013-09-17 NOTE — Progress Notes (Signed)
Patient is doing well without complaints. CBGs reviewed and all within range only on glyburide. F/U growth ultrasound scheduled on 8/20 NST reviewed and reactive

## 2013-09-17 NOTE — Progress Notes (Signed)
Patient is doing well without complaints this morning. She was seen while receiving NST. CBGs all within range only on glyburide. F/U growth on 8/20. FM/PTL precautions reviewed

## 2013-09-17 NOTE — Progress Notes (Signed)
Pt was called from lobby @ [phone] for NST & Ob visit.  She stated, "Y'all have done it to me again!  I have been waiting for 30 minutes. I want it on my record that you are 30 minutes late. My appt was @ 8 o'clock and now it's 8:30!!"  When pt's NST and Ob visit were completed @ 0848, I explained to pt that once she was registered for her visit I needed to give her time to obtain her urine specimen prior to calling her back. She then stated, "It's ok, I get upset very easily lately. I guess because I'm pregnant. I'm fine."  I advised pt that her appts going forward will take 1-1.5 hrs due to her needing NST at the visits.  Pt voiced understanding.

## 2013-09-19 ENCOUNTER — Ambulatory Visit (HOSPITAL_COMMUNITY)
Admission: RE | Admit: 2013-09-19 | Discharge: 2013-09-19 | Disposition: A | Discharge status: Home / Self Care | Payer: Medicaid Other | Source: Ambulatory Visit | Attending: Obstetrics and Gynecology | Admitting: Obstetrics and Gynecology

## 2013-09-19 ENCOUNTER — Encounter (HOSPITAL_COMMUNITY): Payer: Self-pay

## 2013-09-19 ENCOUNTER — Other Ambulatory Visit: Payer: Self-pay | Admitting: Obstetrics and Gynecology

## 2013-09-19 VITALS — BP 108/60 | HR 96

## 2013-09-19 DIAGNOSIS — O24919 Unspecified diabetes mellitus in pregnancy, unspecified trimester: Secondary | ICD-10-CM | POA: Insufficient documentation

## 2013-09-19 DIAGNOSIS — E669 Obesity, unspecified: Secondary | ICD-10-CM

## 2013-09-19 DIAGNOSIS — O10019 Pre-existing essential hypertension complicating pregnancy, unspecified trimester: Secondary | ICD-10-CM

## 2013-09-19 DIAGNOSIS — IMO0002 Reserved for concepts with insufficient information to code with codable children: Secondary | ICD-10-CM

## 2013-09-19 DIAGNOSIS — O10912 Unspecified pre-existing hypertension complicating pregnancy, second trimester: Secondary | ICD-10-CM

## 2013-09-19 DIAGNOSIS — O99210 Obesity complicating pregnancy, unspecified trimester: Secondary | ICD-10-CM

## 2013-09-19 DIAGNOSIS — O09513 Supervision of elderly primigravida, third trimester: Secondary | ICD-10-CM

## 2013-09-19 DIAGNOSIS — O09512 Supervision of elderly primigravida, second trimester: Secondary | ICD-10-CM

## 2013-09-19 DIAGNOSIS — O10913 Unspecified pre-existing hypertension complicating pregnancy, third trimester: Secondary | ICD-10-CM

## 2013-09-19 DIAGNOSIS — O09519 Supervision of elderly primigravida, unspecified trimester: Secondary | ICD-10-CM | POA: Diagnosis present

## 2013-09-19 DIAGNOSIS — O99213 Obesity complicating pregnancy, third trimester: Secondary | ICD-10-CM

## 2013-09-19 DIAGNOSIS — O99340 Other mental disorders complicating pregnancy, unspecified trimester: Secondary | ICD-10-CM

## 2013-09-19 DIAGNOSIS — O289 Unspecified abnormal findings on antenatal screening of mother: Secondary | ICD-10-CM

## 2013-09-19 DIAGNOSIS — O24313 Unspecified pre-existing diabetes mellitus in pregnancy, third trimester: Secondary | ICD-10-CM

## 2013-09-19 HISTORY — DX: Gestational diabetes mellitus in pregnancy, unspecified control: O24.419

## 2013-09-24 ENCOUNTER — Other Ambulatory Visit: Payer: Medicaid Other

## 2013-09-24 ENCOUNTER — Ambulatory Visit (INDEPENDENT_AMBULATORY_CARE_PROVIDER_SITE_OTHER): Payer: Medicaid Other | Admitting: Family

## 2013-09-24 VITALS — BP 112/65 | HR 97 | Temp 98.1°F | Wt 278.7 lb

## 2013-09-24 DIAGNOSIS — O24919 Unspecified diabetes mellitus in pregnancy, unspecified trimester: Secondary | ICD-10-CM

## 2013-09-24 DIAGNOSIS — O10913 Unspecified pre-existing hypertension complicating pregnancy, third trimester: Secondary | ICD-10-CM

## 2013-09-24 DIAGNOSIS — E119 Type 2 diabetes mellitus without complications: Secondary | ICD-10-CM

## 2013-09-24 DIAGNOSIS — O10019 Pre-existing essential hypertension complicating pregnancy, unspecified trimester: Secondary | ICD-10-CM

## 2013-09-24 DIAGNOSIS — O24313 Unspecified pre-existing diabetes mellitus in pregnancy, third trimester: Secondary | ICD-10-CM

## 2013-09-24 LAB — POCT URINALYSIS DIP (DEVICE)
Bilirubin Urine: NEGATIVE
GLUCOSE, UA: NEGATIVE mg/dL
Ketones, ur: NEGATIVE mg/dL
Nitrite: NEGATIVE
Protein, ur: NEGATIVE mg/dL
SPECIFIC GRAVITY, URINE: 1.015 (ref 1.005–1.030)
Urobilinogen, UA: 0.2 mg/dL (ref 0.0–1.0)
pH: 7.5 (ref 5.0–8.0)

## 2013-09-24 NOTE — Progress Notes (Signed)
Growth US done 8/20.  Weekly AFI/NST scheduled @ [facility] on Fridays

## 2013-09-24 NOTE — Progress Notes (Signed)
Reviewed growth ultrasound from 8/20 (49%ile).  Pt did not bring log, states all wnl.  Needs additional test strips as well.

## 2013-09-24 NOTE — Progress Notes (Signed)
Reports occasional mild hip pain.

## 2013-09-25 ENCOUNTER — Encounter: Payer: Self-pay | Admitting: *Deleted

## 2013-09-27 ENCOUNTER — Ambulatory Visit (HOSPITAL_COMMUNITY)
Admission: RE | Admit: 2013-09-27 | Discharge: 2013-09-27 | Disposition: A | Discharge status: Home / Self Care | Payer: Medicaid Other | Source: Ambulatory Visit | Attending: Obstetrics and Gynecology | Admitting: Obstetrics and Gynecology

## 2013-09-27 ENCOUNTER — Encounter (HOSPITAL_COMMUNITY): Payer: Self-pay

## 2013-09-27 VITALS — BP 112/76 | HR 91 | Wt 281.0 lb

## 2013-09-27 DIAGNOSIS — O09519 Supervision of elderly primigravida, unspecified trimester: Secondary | ICD-10-CM | POA: Diagnosis not present

## 2013-09-27 DIAGNOSIS — O289 Unspecified abnormal findings on antenatal screening of mother: Secondary | ICD-10-CM | POA: Insufficient documentation

## 2013-09-27 DIAGNOSIS — O24919 Unspecified diabetes mellitus in pregnancy, unspecified trimester: Secondary | ICD-10-CM | POA: Insufficient documentation

## 2013-09-27 DIAGNOSIS — IMO0002 Reserved for concepts with insufficient information to code with codable children: Secondary | ICD-10-CM

## 2013-09-27 DIAGNOSIS — E669 Obesity, unspecified: Secondary | ICD-10-CM | POA: Diagnosis not present

## 2013-09-27 DIAGNOSIS — O10019 Pre-existing essential hypertension complicating pregnancy, unspecified trimester: Secondary | ICD-10-CM | POA: Diagnosis not present

## 2013-09-27 DIAGNOSIS — O09513 Supervision of elderly primigravida, third trimester: Secondary | ICD-10-CM

## 2013-09-27 DIAGNOSIS — O99210 Obesity complicating pregnancy, unspecified trimester: Secondary | ICD-10-CM

## 2013-09-27 DIAGNOSIS — O99340 Other mental disorders complicating pregnancy, unspecified trimester: Secondary | ICD-10-CM | POA: Diagnosis not present

## 2013-10-01 ENCOUNTER — Ambulatory Visit (INDEPENDENT_AMBULATORY_CARE_PROVIDER_SITE_OTHER): Payer: Medicaid Other | Admitting: Advanced Practice Midwife

## 2013-10-01 ENCOUNTER — Ambulatory Visit (HOSPITAL_COMMUNITY)
Admission: RE | Admit: 2013-10-01 | Discharge: 2013-10-01 | Disposition: A | Discharge status: Home / Self Care | Payer: Medicaid Other | Source: Ambulatory Visit | Attending: Advanced Practice Midwife | Admitting: Advanced Practice Midwife

## 2013-10-01 ENCOUNTER — Encounter: Payer: Self-pay | Admitting: Advanced Practice Midwife

## 2013-10-01 VITALS — BP 119/68 | HR 122 | Wt 278.7 lb

## 2013-10-01 DIAGNOSIS — O24919 Unspecified diabetes mellitus in pregnancy, unspecified trimester: Secondary | ICD-10-CM | POA: Insufficient documentation

## 2013-10-01 DIAGNOSIS — O09519 Supervision of elderly primigravida, unspecified trimester: Secondary | ICD-10-CM | POA: Insufficient documentation

## 2013-10-01 DIAGNOSIS — E119 Type 2 diabetes mellitus without complications: Secondary | ICD-10-CM | POA: Insufficient documentation

## 2013-10-01 DIAGNOSIS — O368390 Maternal care for abnormalities of the fetal heart rate or rhythm, unspecified trimester, not applicable or unspecified: Secondary | ICD-10-CM

## 2013-10-01 DIAGNOSIS — O10913 Unspecified pre-existing hypertension complicating pregnancy, third trimester: Secondary | ICD-10-CM

## 2013-10-01 DIAGNOSIS — O24313 Unspecified pre-existing diabetes mellitus in pregnancy, third trimester: Secondary | ICD-10-CM

## 2013-10-01 DIAGNOSIS — O99210 Obesity complicating pregnancy, unspecified trimester: Secondary | ICD-10-CM | POA: Diagnosis not present

## 2013-10-01 DIAGNOSIS — O09513 Supervision of elderly primigravida, third trimester: Secondary | ICD-10-CM

## 2013-10-01 DIAGNOSIS — E669 Obesity, unspecified: Secondary | ICD-10-CM | POA: Insufficient documentation

## 2013-10-01 DIAGNOSIS — O10019 Pre-existing essential hypertension complicating pregnancy, unspecified trimester: Secondary | ICD-10-CM

## 2013-10-01 DIAGNOSIS — O99213 Obesity complicating pregnancy, third trimester: Secondary | ICD-10-CM

## 2013-10-01 LAB — POCT URINALYSIS DIP (DEVICE)
Glucose, UA: NEGATIVE mg/dL
KETONES UR: 40 mg/dL — AB
Leukocytes, UA: NEGATIVE
Nitrite: NEGATIVE
PH: 6 (ref 5.0–8.0)
PROTEIN: 30 mg/dL — AB
SPECIFIC GRAVITY, URINE: 1.025 (ref 1.005–1.030)
Urobilinogen, UA: 1 mg/dL (ref 0.0–1.0)

## 2013-10-01 LAB — GLUCOSE, CAPILLARY: Glucose-Capillary: 123 mg/dL — ABNORMAL HIGH (ref 70–99)

## 2013-10-01 NOTE — Progress Notes (Signed)
Doing well. Does not have book today. States all FBS "well under" 90.  States all but one 2 hr PC sugars are under 120. NST not quite reactive yet. Reassuring and good variabilty, but accels not quite 15 beats. Will extend and if not reactive, may need BPP.  >>  BPP 8/8.  Amniotic fluid normal

## 2013-10-01 NOTE — Patient Instructions (Signed)
Third Trimester of Pregnancy The third trimester is from week 29 through week 42, months 7 through 9. The third trimester is a time when the fetus is growing rapidly. At the end of the ninth month, the fetus is about 20 inches in length and weighs 6-10 pounds.  BODY CHANGES Your body goes through many changes during pregnancy. The changes vary from woman to woman.   Your weight will continue to increase. You can expect to gain 25-35 pounds (11-16 kg) by the end of the pregnancy.  You may begin to get stretch marks on your hips, abdomen, and breasts.  You may urinate more often because the fetus is moving lower into your pelvis and pressing on your bladder.  You may develop or continue to have heartburn as a result of your pregnancy.  You may develop constipation because certain hormones are causing the muscles that push waste through your intestines to slow down.  You may develop hemorrhoids or swollen, bulging veins (varicose veins).  You may have pelvic pain because of the weight gain and pregnancy hormones relaxing your joints between the bones in your pelvis. Backaches may result from overexertion of the muscles supporting your posture.  You may have changes in your hair. These can include thickening of your hair, rapid growth, and changes in texture. Some women also have hair loss during or after pregnancy, or hair that feels dry or thin. Your hair will most likely return to normal after your baby is born.  Your breasts will continue to grow and be tender. A yellow discharge may leak from your breasts called colostrum.  Your belly button may stick out.  You may feel short of breath because of your expanding uterus.  You may notice the fetus "dropping," or moving lower in your abdomen.  You may have a bloody mucus discharge. This usually occurs a few days to a week before labor begins.  Your cervix becomes thin and soft (effaced) near your due date. WHAT TO EXPECT AT YOUR PRENATAL  EXAMS  You will have prenatal exams every 2 weeks until week 36. Then, you will have weekly prenatal exams. During a routine prenatal visit:  You will be weighed to make sure you and the fetus are growing normally.  Your blood pressure is taken.  Your abdomen will be measured to track your baby's growth.  The fetal heartbeat will be listened to.  Any test results from the previous visit will be discussed.  You may have a cervical check near your due date to see if you have effaced. At around 36 weeks, your caregiver will check your cervix. At the same time, your caregiver will also perform a test on the secretions of the vaginal tissue. This test is to determine if a type of bacteria, Group B streptococcus, is present. Your caregiver will explain this further. Your caregiver may ask you:  What your birth plan is.  How you are feeling.  If you are feeling the baby move.  If you have had any abnormal symptoms, such as leaking fluid, bleeding, severe headaches, or abdominal cramping.  If you have any questions. Other tests or screenings that may be performed during your third trimester include:  Blood tests that check for low iron levels (anemia).  Fetal testing to check the health, activity level, and growth of the fetus. Testing is done if you have certain medical conditions or if there are problems during the pregnancy. FALSE LABOR You may feel small, irregular contractions that   eventually go away. These are called Braxton Hicks contractions, or false labor. Contractions may last for hours, days, or even weeks before true labor sets in. If contractions come at regular intervals, intensify, or become painful, it is best to be seen by your caregiver.  SIGNS OF LABOR   Menstrual-like cramps.  Contractions that are 5 minutes apart or less.  Contractions that start on the top of the uterus and spread down to the lower abdomen and back.  A sense of increased pelvic pressure or back  pain.  A watery or bloody mucus discharge that comes from the vagina. If you have any of these signs before the 37th week of pregnancy, call your caregiver right away. You need to go to the hospital to get checked immediately. HOME CARE INSTRUCTIONS   Avoid all smoking, herbs, alcohol, and unprescribed drugs. These chemicals affect the formation and growth of the baby.  Follow your caregiver's instructions regarding medicine use. There are medicines that are either safe or unsafe to take during pregnancy.  Exercise only as directed by your caregiver. Experiencing uterine cramps is a good sign to stop exercising.  Continue to eat regular, healthy meals.  Wear a good support bra for breast tenderness.  Do not use hot tubs, steam rooms, or saunas.  Wear your seat belt at all times when driving.  Avoid raw meat, uncooked cheese, cat litter boxes, and soil used by cats. These carry germs that can cause birth defects in the baby.  Take your prenatal vitamins.  Try taking a stool softener (if your caregiver approves) if you develop constipation. Eat more high-fiber foods, such as fresh vegetables or fruit and whole grains. Drink plenty of fluids to keep your urine clear or pale yellow.  Take warm sitz baths to soothe any pain or discomfort caused by hemorrhoids. Use hemorrhoid cream if your caregiver approves.  If you develop varicose veins, wear support hose. Elevate your feet for 15 minutes, 3-4 times a day. Limit salt in your diet.  Avoid heavy lifting, wear low heal shoes, and practice good posture.  Rest a lot with your legs elevated if you have leg cramps or low back pain.  Visit your dentist if you have not gone during your pregnancy. Use a soft toothbrush to brush your teeth and be gentle when you floss.  A sexual relationship may be continued unless your caregiver directs you otherwise.  Do not travel far distances unless it is absolutely necessary and only with the approval  of your caregiver.  Take prenatal classes to understand, practice, and ask questions about the labor and delivery.  Make a trial run to the hospital.  Pack your hospital bag.  Prepare the baby's nursery.  Continue to go to all your prenatal visits as directed by your caregiver. SEEK MEDICAL CARE IF:  You are unsure if you are in labor or if your water has broken.  You have dizziness.  You have mild pelvic cramps, pelvic pressure, or nagging pain in your abdominal area.  You have persistent nausea, vomiting, or diarrhea.  You have a bad smelling vaginal discharge.  You have pain with urination. SEEK IMMEDIATE MEDICAL CARE IF:   You have a fever.  You are leaking fluid from your vagina.  You have spotting or bleeding from your vagina.  You have severe abdominal cramping or pain.  You have rapid weight loss or gain.  You have shortness of breath with chest pain.  You notice sudden or extreme swelling   of your face, hands, ankles, feet, or legs.  You have not felt your baby move in over an hour.  You have severe headaches that do not go away with medicine.  You have vision changes. Document Released: 01/11/2001 Document Revised: 01/22/2013 Document Reviewed: 03/20/2012 Exit[employer] Patient Information 2015 [employer]. This information is not intended to replace advice given to you by your health care provider. Make sure you discuss any questions you have with your health care provider.  

## 2013-10-01 NOTE — Progress Notes (Signed)
Pt reports increased heartburn- Tums are not effective.  Pt reports she is still not taking Metformin because she "self-corrected" her sugars by adjusting her diet. Weekly NST/AFI @ [facility] on Fridays

## 2013-10-04 ENCOUNTER — Ambulatory Visit (HOSPITAL_COMMUNITY)
Admission: RE | Admit: 2013-10-04 | Discharge: 2013-10-04 | Disposition: A | Discharge status: Home / Self Care | Payer: Medicaid Other | Source: Ambulatory Visit | Attending: Obstetrics and Gynecology | Admitting: Obstetrics and Gynecology

## 2013-10-04 ENCOUNTER — Encounter (HOSPITAL_COMMUNITY): Payer: Self-pay

## 2013-10-04 ENCOUNTER — Ambulatory Visit (HOSPITAL_COMMUNITY)
Admission: RE | Admit: 2013-10-04 | Discharge: 2013-10-04 | Disposition: A | Discharge status: Home / Self Care | Payer: Medicaid Other | Source: Ambulatory Visit | Attending: Advanced Practice Midwife | Admitting: Advanced Practice Midwife

## 2013-10-04 VITALS — BP 112/68 | HR 93 | Wt 281.0 lb

## 2013-10-04 DIAGNOSIS — O24919 Unspecified diabetes mellitus in pregnancy, unspecified trimester: Secondary | ICD-10-CM | POA: Diagnosis not present

## 2013-10-04 DIAGNOSIS — E669 Obesity, unspecified: Secondary | ICD-10-CM | POA: Insufficient documentation

## 2013-10-04 DIAGNOSIS — O289 Unspecified abnormal findings on antenatal screening of mother: Secondary | ICD-10-CM

## 2013-10-04 DIAGNOSIS — O99340 Other mental disorders complicating pregnancy, unspecified trimester: Secondary | ICD-10-CM | POA: Diagnosis not present

## 2013-10-04 DIAGNOSIS — O99210 Obesity complicating pregnancy, unspecified trimester: Secondary | ICD-10-CM | POA: Diagnosis not present

## 2013-10-04 DIAGNOSIS — O09519 Supervision of elderly primigravida, unspecified trimester: Secondary | ICD-10-CM | POA: Insufficient documentation

## 2013-10-04 DIAGNOSIS — IMO0002 Reserved for concepts with insufficient information to code with codable children: Secondary | ICD-10-CM

## 2013-10-04 DIAGNOSIS — O10019 Pre-existing essential hypertension complicating pregnancy, unspecified trimester: Secondary | ICD-10-CM | POA: Insufficient documentation

## 2013-10-04 DIAGNOSIS — O09513 Supervision of elderly primigravida, third trimester: Secondary | ICD-10-CM

## 2013-10-08 ENCOUNTER — Ambulatory Visit (INDEPENDENT_AMBULATORY_CARE_PROVIDER_SITE_OTHER): Payer: Medicaid Other | Admitting: Advanced Practice Midwife

## 2013-10-08 VITALS — BP 113/72 | HR 92 | Temp 98.3°F | Wt 281.6 lb

## 2013-10-08 DIAGNOSIS — E119 Type 2 diabetes mellitus without complications: Secondary | ICD-10-CM

## 2013-10-08 DIAGNOSIS — O10913 Unspecified pre-existing hypertension complicating pregnancy, third trimester: Secondary | ICD-10-CM

## 2013-10-08 DIAGNOSIS — Z23 Encounter for immunization: Secondary | ICD-10-CM

## 2013-10-08 DIAGNOSIS — O24313 Unspecified pre-existing diabetes mellitus in pregnancy, third trimester: Secondary | ICD-10-CM

## 2013-10-08 DIAGNOSIS — O24919 Unspecified diabetes mellitus in pregnancy, unspecified trimester: Secondary | ICD-10-CM

## 2013-10-08 DIAGNOSIS — O10019 Pre-existing essential hypertension complicating pregnancy, unspecified trimester: Secondary | ICD-10-CM

## 2013-10-08 LAB — POCT URINALYSIS DIP (DEVICE)
Glucose, UA: NEGATIVE mg/dL
Nitrite: NEGATIVE
PH: 7 (ref 5.0–8.0)
PROTEIN: 30 mg/dL — AB
SPECIFIC GRAVITY, URINE: 1.02 (ref 1.005–1.030)
UROBILINOGEN UA: 0.2 mg/dL (ref 0.0–1.0)

## 2013-10-08 NOTE — Addendum Note (Signed)
Addended by: LEFTWICH-KIRBY, Jettie Mannor A on: 10/08/2013 09:36 AM   Modules accepted: Level of Service  

## 2013-10-08 NOTE — Progress Notes (Signed)
Doing well.  Good fetal movement, denies vaginal bleeding, LOF, regular contractions.  NST-R today.  NST for GDM.  No log today, reports all her sugars are within normal range.  Need to review log/meter at next visit.

## 2013-10-08 NOTE — Progress Notes (Signed)
Weekly AFI & NST @ [facility] on Fridays.

## 2013-10-11 ENCOUNTER — Ambulatory Visit (HOSPITAL_COMMUNITY)
Admission: RE | Admit: 2013-10-11 | Discharge: 2013-10-11 | Disposition: A | Discharge status: Home / Self Care | Payer: Medicaid Other | Source: Ambulatory Visit | Attending: Family Medicine | Admitting: Family Medicine

## 2013-10-11 ENCOUNTER — Ambulatory Visit (HOSPITAL_COMMUNITY)
Admission: RE | Admit: 2013-10-11 | Discharge: 2013-10-11 | Disposition: A | Discharge status: Home / Self Care | Payer: Medicaid Other | Source: Ambulatory Visit | Attending: Obstetrics and Gynecology | Admitting: Obstetrics and Gynecology

## 2013-10-11 DIAGNOSIS — O24919 Unspecified diabetes mellitus in pregnancy, unspecified trimester: Secondary | ICD-10-CM | POA: Diagnosis not present

## 2013-10-11 DIAGNOSIS — O10019 Pre-existing essential hypertension complicating pregnancy, unspecified trimester: Secondary | ICD-10-CM | POA: Diagnosis not present

## 2013-10-11 DIAGNOSIS — O09513 Supervision of elderly primigravida, third trimester: Secondary | ICD-10-CM

## 2013-10-11 DIAGNOSIS — O99340 Other mental disorders complicating pregnancy, unspecified trimester: Secondary | ICD-10-CM | POA: Diagnosis not present

## 2013-10-11 DIAGNOSIS — O99210 Obesity complicating pregnancy, unspecified trimester: Secondary | ICD-10-CM | POA: Diagnosis not present

## 2013-10-11 DIAGNOSIS — O289 Unspecified abnormal findings on antenatal screening of mother: Secondary | ICD-10-CM | POA: Diagnosis not present

## 2013-10-11 DIAGNOSIS — O09519 Supervision of elderly primigravida, unspecified trimester: Secondary | ICD-10-CM | POA: Insufficient documentation

## 2013-10-11 DIAGNOSIS — E669 Obesity, unspecified: Secondary | ICD-10-CM | POA: Insufficient documentation

## 2013-10-11 DIAGNOSIS — IMO0002 Reserved for concepts with insufficient information to code with codable children: Secondary | ICD-10-CM

## 2013-10-15 ENCOUNTER — Other Ambulatory Visit: Payer: Self-pay | Admitting: Obstetrics and Gynecology

## 2013-10-15 ENCOUNTER — Encounter: Payer: Self-pay | Admitting: Obstetrics and Gynecology

## 2013-10-15 ENCOUNTER — Ambulatory Visit (INDEPENDENT_AMBULATORY_CARE_PROVIDER_SITE_OTHER): Payer: Medicaid Other | Admitting: Obstetrics and Gynecology

## 2013-10-15 VITALS — BP 109/69 | HR 106 | Temp 98.7°F | Wt 280.5 lb

## 2013-10-15 DIAGNOSIS — Z23 Encounter for immunization: Secondary | ICD-10-CM

## 2013-10-15 DIAGNOSIS — O09519 Supervision of elderly primigravida, unspecified trimester: Secondary | ICD-10-CM

## 2013-10-15 DIAGNOSIS — O09513 Supervision of elderly primigravida, third trimester: Secondary | ICD-10-CM

## 2013-10-15 DIAGNOSIS — O24919 Unspecified diabetes mellitus in pregnancy, unspecified trimester: Secondary | ICD-10-CM

## 2013-10-15 LAB — OB RESULTS CONSOLE GC/CHLAMYDIA
Chlamydia: NEGATIVE
Gonorrhea: NEGATIVE

## 2013-10-15 LAB — POCT URINALYSIS DIP (DEVICE)
Glucose, UA: NEGATIVE mg/dL
HGB URINE DIPSTICK: NEGATIVE
Ketones, ur: NEGATIVE mg/dL
Leukocytes, UA: NEGATIVE
NITRITE: NEGATIVE
Protein, ur: NEGATIVE mg/dL
Specific Gravity, Urine: 1.02 (ref 1.005–1.030)
UROBILINOGEN UA: 0.2 mg/dL (ref 0.0–1.0)
pH: 6.5 (ref 5.0–8.0)

## 2013-10-15 LAB — OB RESULTS CONSOLE GBS: GBS: NEGATIVE

## 2013-10-15 NOTE — Progress Notes (Signed)
Pt continues weekly NST/AFI @ [facility] on Fridays

## 2013-10-15 NOTE — Patient Instructions (Signed)
Diabetes Mellitus and Food It is important for you to manage your blood sugar (glucose) level. Your blood glucose level can be greatly affected by what you eat. Eating healthier foods in the appropriate amounts throughout the day at about the same time each day will help you control your blood glucose level. It can also help slow or prevent worsening of your diabetes mellitus. Healthy eating may even help you improve the level of your blood pressure and reach or maintain a healthy weight.  HOW CAN FOOD AFFECT ME? Carbohydrates Carbohydrates affect your blood glucose level more than any other type of food. Your dietitian will help you determine how many carbohydrates to eat at each meal and teach you how to count carbohydrates. Counting carbohydrates is important to keep your blood glucose at a healthy level, especially if you are using insulin or taking certain medicines for diabetes mellitus. Alcohol Alcohol can cause sudden decreases in blood glucose (hypoglycemia), especially if you use insulin or take certain medicines for diabetes mellitus. Hypoglycemia can be a life-threatening condition. Symptoms of hypoglycemia (sleepiness, dizziness, and disorientation) are similar to symptoms of having too much alcohol.  If your health care provider has given you approval to drink alcohol, do so in moderation and use the following guidelines:  Women should not have more than one drink per day, and men should not have more than two drinks per day. One drink is equal to:  12 oz of beer.  5 oz of wine.  1 oz of hard liquor.  Do not drink on an empty stomach.  Keep yourself hydrated. Have water, diet soda, or unsweetened iced tea.  Regular soda, juice, and other mixers might contain a lot of carbohydrates and should be counted. WHAT FOODS ARE NOT RECOMMENDED? As you make food choices, it is important to remember that all foods are not the same. Some foods have fewer nutrients per serving than other  foods, even though they might have the same number of calories or carbohydrates. It is difficult to get your body what it needs when you eat foods with fewer nutrients. Examples of foods that you should avoid that are high in calories and carbohydrates but low in nutrients include:  Trans fats (most processed foods list trans fats on the Nutrition Facts label).  Regular soda.  Juice.  Candy.  Sweets, such as cake, pie, doughnuts, and cookies.  Fried foods. WHAT FOODS CAN I EAT? Have nutrient-rich foods, which will nourish your body and keep you healthy. The food you should eat also will depend on several factors, including:  The calories you need.  The medicines you take.  Your weight.  Your blood glucose level.  Your blood pressure level.  Your cholesterol level. You also should eat a variety of foods, including:  Protein, such as meat, poultry, fish, tofu, nuts, and seeds (lean animal proteins are best).  Fruits.  Vegetables.  Dairy products, such as milk, cheese, and yogurt (low fat is best).  Breads, grains, pasta, cereal, rice, and beans.  Fats such as olive oil, trans fat-free margarine, canola oil, avocado, and olives. DOES EVERYONE WITH DIABETES MELLITUS HAVE THE SAME MEAL PLAN? Because every person with diabetes mellitus is different, there is not one meal plan that works for everyone. It is very important that you meet with a dietitian who will help you create a meal plan that is just right for you. Document Released: 10/14/2004 Document Revised: 01/22/2013 Document Reviewed: 12/14/2012 Exit[employer] Patient Information 2015 [employer]. This   information is not intended to replace advice given to you by your health care provider. Make sure you discuss any questions you have with your health care provider.  

## 2013-10-15 NOTE — Progress Notes (Signed)
GBS, GC/CT, HIV.  Still declines flu vaccine. Reviewed CBGs> all within target range except a 119 FBS today after Pop Tarts and Pepsi. Continue glyburide  bid and counseled on dietary adherence. Kick counts and plans reviewed. [facility] visit Fri.

## 2013-10-16 LAB — GC/CHLAMYDIA PROBE AMP
CT Probe RNA: NEGATIVE
GC Probe RNA: NEGATIVE

## 2013-10-16 LAB — HIV ANTIBODY (ROUTINE TESTING W REFLEX): HIV 1&2 Ab, 4th Generation: NONREACTIVE

## 2013-10-17 LAB — CULTURE, BETA STREP (GROUP B ONLY)

## 2013-10-18 ENCOUNTER — Other Ambulatory Visit (HOSPITAL_COMMUNITY): Payer: Medicaid Other

## 2013-10-18 ENCOUNTER — Other Ambulatory Visit: Payer: Self-pay | Admitting: Obstetrics and Gynecology

## 2013-10-18 ENCOUNTER — Ambulatory Visit (HOSPITAL_COMMUNITY)
Admission: RE | Admit: 2013-10-18 | Discharge: 2013-10-18 | Disposition: A | Discharge status: Home / Self Care | Payer: Medicaid Other | Source: Ambulatory Visit | Attending: Obstetrics and Gynecology | Admitting: Obstetrics and Gynecology

## 2013-10-18 ENCOUNTER — Ambulatory Visit (HOSPITAL_COMMUNITY): Payer: Medicaid Other

## 2013-10-18 ENCOUNTER — Ambulatory Visit (HOSPITAL_COMMUNITY)
Admission: RE | Admit: 2013-10-18 | Discharge: 2013-10-18 | Disposition: A | Discharge status: Home / Self Care | Payer: Medicaid Other | Source: Ambulatory Visit | Attending: Family Medicine | Admitting: Family Medicine

## 2013-10-18 DIAGNOSIS — O09519 Supervision of elderly primigravida, unspecified trimester: Secondary | ICD-10-CM | POA: Insufficient documentation

## 2013-10-18 DIAGNOSIS — O10019 Pre-existing essential hypertension complicating pregnancy, unspecified trimester: Secondary | ICD-10-CM

## 2013-10-18 DIAGNOSIS — O289 Unspecified abnormal findings on antenatal screening of mother: Secondary | ICD-10-CM | POA: Diagnosis present

## 2013-10-18 DIAGNOSIS — IMO0002 Reserved for concepts with insufficient information to code with codable children: Secondary | ICD-10-CM

## 2013-10-18 DIAGNOSIS — O99210 Obesity complicating pregnancy, unspecified trimester: Secondary | ICD-10-CM

## 2013-10-18 DIAGNOSIS — O09513 Supervision of elderly primigravida, third trimester: Secondary | ICD-10-CM

## 2013-10-18 DIAGNOSIS — E669 Obesity, unspecified: Secondary | ICD-10-CM

## 2013-10-18 DIAGNOSIS — O99340 Other mental disorders complicating pregnancy, unspecified trimester: Secondary | ICD-10-CM | POA: Insufficient documentation

## 2013-10-18 DIAGNOSIS — O24919 Unspecified diabetes mellitus in pregnancy, unspecified trimester: Secondary | ICD-10-CM

## 2013-10-18 NOTE — ED Notes (Signed)
Maternal heart rate elevated into 120's.  MD aware.  Pt smoked prior to having NST.

## 2013-10-22 ENCOUNTER — Encounter: Payer: Self-pay | Admitting: Obstetrics and Gynecology

## 2013-10-22 ENCOUNTER — Ambulatory Visit (INDEPENDENT_AMBULATORY_CARE_PROVIDER_SITE_OTHER): Payer: Medicaid Other | Admitting: Obstetrics and Gynecology

## 2013-10-22 VITALS — BP 106/73 | HR 107 | Temp 98.8°F | Wt 281.1 lb

## 2013-10-22 DIAGNOSIS — O10019 Pre-existing essential hypertension complicating pregnancy, unspecified trimester: Secondary | ICD-10-CM

## 2013-10-22 DIAGNOSIS — O10913 Unspecified pre-existing hypertension complicating pregnancy, third trimester: Secondary | ICD-10-CM

## 2013-10-22 DIAGNOSIS — O24313 Unspecified pre-existing diabetes mellitus in pregnancy, third trimester: Secondary | ICD-10-CM

## 2013-10-22 DIAGNOSIS — E119 Type 2 diabetes mellitus without complications: Secondary | ICD-10-CM

## 2013-10-22 DIAGNOSIS — O24919 Unspecified diabetes mellitus in pregnancy, unspecified trimester: Secondary | ICD-10-CM

## 2013-10-22 LAB — POCT URINALYSIS DIP (DEVICE)
Bilirubin Urine: NEGATIVE
GLUCOSE, UA: NEGATIVE mg/dL
HGB URINE DIPSTICK: NEGATIVE
Ketones, ur: NEGATIVE mg/dL
Leukocytes, UA: NEGATIVE
Nitrite: NEGATIVE
Protein, ur: NEGATIVE mg/dL
SPECIFIC GRAVITY, URINE: 1.015 (ref 1.005–1.030)
Urobilinogen, UA: 0.2 mg/dL (ref 0.0–1.0)
pH: 7 (ref 5.0–8.0)

## 2013-10-22 NOTE — Progress Notes (Signed)
Had some positional dizziness last weekend, not at present. CBGs normal during episode. Advised slow position changes and AVS given on near syncope. Reports fastings all <90 and PPs all < 120 but forgot log book> bring next.  IOL 10/7. Continue fetal testing.

## 2013-10-22 NOTE — Patient Instructions (Signed)
Near-Syncope Near-syncope (commonly known as near fainting) is sudden weakness, dizziness, or feeling like you might pass out. During an episode of near-syncope, you may also develop pale skin, have tunnel vision, or feel sick to your stomach (nauseous). Near-syncope may occur when getting up after sitting or while standing for a long time. It is caused by a sudden decrease in blood flow to the brain. This decrease can result from various causes or triggers, most of which are not serious. However, because near-syncope can sometimes be a sign of something serious, a medical evaluation is required. The specific cause is often not determined. HOME CARE INSTRUCTIONS  Monitor your condition for any changes. The following actions may help to alleviate any discomfort you are experiencing:  Have someone stay with you until you feel stable.  Lie down right away and prop your feet up if you start feeling like you might faint. Breathe deeply and steadily. Wait until all the symptoms have passed. Most of these episodes last only a few minutes. You may feel tired for several hours.   Drink enough fluids to keep your urine clear or pale yellow.   If you are taking blood pressure or heart medicine, get up slowly when seated or lying down. Take several minutes to sit and then stand. This can reduce dizziness.  Follow up with your health care provider as directed. SEEK IMMEDIATE MEDICAL CARE IF:   You have a severe headache.   You have unusual pain in the chest, abdomen, or back.   You are bleeding from the mouth or rectum, or you have black or tarry stool.   You have an irregular or very fast heartbeat.   You have repeated fainting or have seizure-like jerking during an episode.   You faint when sitting or lying down.   You have confusion.   You have difficulty walking.   You have severe weakness.   You have vision problems.  MAKE SURE YOU:   Understand these instructions.  Will  watch your condition.  Will get help right away if you are not doing well or get worse. Document Released: 01/17/2005 Document Revised: 01/22/2013 Document Reviewed: 06/22/2012 Exit[employer] Patient Information 2015 [employer]. This information is not intended to replace advice given to you by your health care provider. Make sure you discuss any questions you have with your health care provider.  

## 2013-10-22 NOTE — Progress Notes (Signed)
States since Saturday has felt faint often- especially when looks up or when she is laying down and turns .

## 2013-10-22 NOTE — Progress Notes (Signed)
Weekly visits @ [facility] on Fridays for AFI/NST.  Last growth scan done 9/18.

## 2013-10-23 ENCOUNTER — Telehealth (HOSPITAL_COMMUNITY): Payer: Self-pay | Admitting: *Deleted

## 2013-10-23 NOTE — Telephone Encounter (Signed)
Preadmission screen  

## 2013-10-25 ENCOUNTER — Ambulatory Visit (HOSPITAL_COMMUNITY): Payer: Medicaid Other

## 2013-10-25 ENCOUNTER — Telehealth (HOSPITAL_COMMUNITY): Payer: Self-pay | Admitting: *Deleted

## 2013-10-25 NOTE — Telephone Encounter (Signed)
Colleen Wiggins called and stated she has had some family issues come up and will not be able to make it to her NST/AFI today.  Instructed patient to come to her appointments next week.  Call MD or go to [facility] with any concerns for labor, bleeding or decreased fetal movement.  Pt verbalized understanding.  Called clinic and made Dr. Macon Large aware as well as Diane Day, RN.  Ok to follow up with regular appointments.

## 2013-10-29 ENCOUNTER — Encounter: Payer: Self-pay | Admitting: Advanced Practice Midwife

## 2013-10-29 ENCOUNTER — Ambulatory Visit (INDEPENDENT_AMBULATORY_CARE_PROVIDER_SITE_OTHER): Payer: Medicaid Other | Admitting: Advanced Practice Midwife

## 2013-10-29 VITALS — BP 115/70 | HR 120 | Wt 279.7 lb

## 2013-10-29 DIAGNOSIS — E119 Type 2 diabetes mellitus without complications: Secondary | ICD-10-CM

## 2013-10-29 DIAGNOSIS — O10019 Pre-existing essential hypertension complicating pregnancy, unspecified trimester: Secondary | ICD-10-CM

## 2013-10-29 DIAGNOSIS — O24919 Unspecified diabetes mellitus in pregnancy, unspecified trimester: Secondary | ICD-10-CM

## 2013-10-29 DIAGNOSIS — O24313 Unspecified pre-existing diabetes mellitus in pregnancy, third trimester: Secondary | ICD-10-CM

## 2013-10-29 DIAGNOSIS — O10913 Unspecified pre-existing hypertension complicating pregnancy, third trimester: Secondary | ICD-10-CM

## 2013-10-29 LAB — POCT URINALYSIS DIP (DEVICE)
BILIRUBIN URINE: NEGATIVE
Glucose, UA: NEGATIVE mg/dL
Hgb urine dipstick: NEGATIVE
Ketones, ur: NEGATIVE mg/dL
Leukocytes, UA: NEGATIVE
NITRITE: NEGATIVE
PH: 7 (ref 5.0–8.0)
PROTEIN: NEGATIVE mg/dL
Specific Gravity, Urine: 1.015 (ref 1.005–1.030)
Urobilinogen, UA: 0.2 mg/dL (ref 0.0–1.0)

## 2013-10-29 NOTE — Patient Instructions (Signed)
Third Trimester of Pregnancy The third trimester is from week 29 through week 42, months 7 through 9. The third trimester is a time when the fetus is growing rapidly. At the end of the ninth month, the fetus is about 20 inches in length and weighs 6-10 pounds.  BODY CHANGES Your body goes through many changes during pregnancy. The changes vary from woman to woman.   Your weight will continue to increase. You can expect to gain 25-35 pounds (11-16 kg) by the end of the pregnancy.  You may begin to get stretch marks on your hips, abdomen, and breasts.  You may urinate more often because the fetus is moving lower into your pelvis and pressing on your bladder.  You may develop or continue to have heartburn as a result of your pregnancy.  You may develop constipation because certain hormones are causing the muscles that push waste through your intestines to slow down.  You may develop hemorrhoids or swollen, bulging veins (varicose veins).  You may have pelvic pain because of the weight gain and pregnancy hormones relaxing your joints between the bones in your pelvis. Backaches may result from overexertion of the muscles supporting your posture.  You may have changes in your hair. These can include thickening of your hair, rapid growth, and changes in texture. Some women also have hair loss during or after pregnancy, or hair that feels dry or thin. Your hair will most likely return to normal after your baby is born.  Your breasts will continue to grow and be tender. A yellow discharge may leak from your breasts called colostrum.  Your belly button may stick out.  You may feel short of breath because of your expanding uterus.  You may notice the fetus "dropping," or moving lower in your abdomen.  You may have a bloody mucus discharge. This usually occurs a few days to a week before labor begins.  Your cervix becomes thin and soft (effaced) near your due date. WHAT TO EXPECT AT YOUR PRENATAL  EXAMS  You will have prenatal exams every 2 weeks until week 36. Then, you will have weekly prenatal exams. During a routine prenatal visit:  You will be weighed to make sure you and the fetus are growing normally.  Your blood pressure is taken.  Your abdomen will be measured to track your baby's growth.  The fetal heartbeat will be listened to.  Any test results from the previous visit will be discussed.  You may have a cervical check near your due date to see if you have effaced. At around 36 weeks, your caregiver will check your cervix. At the same time, your caregiver will also perform a test on the secretions of the vaginal tissue. This test is to determine if a type of bacteria, Group B streptococcus, is present. Your caregiver will explain this further. Your caregiver may ask you:  What your birth plan is.  How you are feeling.  If you are feeling the baby move.  If you have had any abnormal symptoms, such as leaking fluid, bleeding, severe headaches, or abdominal cramping.  If you have any questions. Other tests or screenings that may be performed during your third trimester include:  Blood tests that check for low iron levels (anemia).  Fetal testing to check the health, activity level, and growth of the fetus. Testing is done if you have certain medical conditions or if there are problems during the pregnancy. FALSE LABOR You may feel small, irregular contractions that   eventually go away. These are called Braxton Hicks contractions, or false labor. Contractions may last for hours, days, or even weeks before true labor sets in. If contractions come at regular intervals, intensify, or become painful, it is best to be seen by your caregiver.  SIGNS OF LABOR   Menstrual-like cramps.  Contractions that are 5 minutes apart or less.  Contractions that start on the top of the uterus and spread down to the lower abdomen and back.  A sense of increased pelvic pressure or back  pain.  A watery or bloody mucus discharge that comes from the vagina. If you have any of these signs before the 37th week of pregnancy, call your caregiver right away. You need to go to the hospital to get checked immediately. HOME CARE INSTRUCTIONS   Avoid all smoking, herbs, alcohol, and unprescribed drugs. These chemicals affect the formation and growth of the baby.  Follow your caregiver's instructions regarding medicine use. There are medicines that are either safe or unsafe to take during pregnancy.  Exercise only as directed by your caregiver. Experiencing uterine cramps is a good sign to stop exercising.  Continue to eat regular, healthy meals.  Wear a good support bra for breast tenderness.  Do not use hot tubs, steam rooms, or saunas.  Wear your seat belt at all times when driving.  Avoid raw meat, uncooked cheese, cat litter boxes, and soil used by cats. These carry germs that can cause birth defects in the baby.  Take your prenatal vitamins.  Try taking a stool softener (if your caregiver approves) if you develop constipation. Eat more high-fiber foods, such as fresh vegetables or fruit and whole grains. Drink plenty of fluids to keep your urine clear or pale yellow.  Take warm sitz baths to soothe any pain or discomfort caused by hemorrhoids. Use hemorrhoid cream if your caregiver approves.  If you develop varicose veins, wear support hose. Elevate your feet for 15 minutes, 3-4 times a day. Limit salt in your diet.  Avoid heavy lifting, wear low heal shoes, and practice good posture.  Rest a lot with your legs elevated if you have leg cramps or low back pain.  Visit your dentist if you have not gone during your pregnancy. Use a soft toothbrush to brush your teeth and be gentle when you floss.  A sexual relationship may be continued unless your caregiver directs you otherwise.  Do not travel far distances unless it is absolutely necessary and only with the approval  of your caregiver.  Take prenatal classes to understand, practice, and ask questions about the labor and delivery.  Make a trial run to the hospital.  Pack your hospital bag.  Prepare the baby's nursery.  Continue to go to all your prenatal visits as directed by your caregiver. SEEK MEDICAL CARE IF:  You are unsure if you are in labor or if your water has broken.  You have dizziness.  You have mild pelvic cramps, pelvic pressure, or nagging pain in your abdominal area.  You have persistent nausea, vomiting, or diarrhea.  You have a bad smelling vaginal discharge.  You have pain with urination. SEEK IMMEDIATE MEDICAL CARE IF:   You have a fever.  You are leaking fluid from your vagina.  You have spotting or bleeding from your vagina.  You have severe abdominal cramping or pain.  You have rapid weight loss or gain.  You have shortness of breath with chest pain.  You notice sudden or extreme swelling   of your face, hands, ankles, feet, or legs.  You have not felt your baby move in over an hour.  You have severe headaches that do not go away with medicine.  You have vision changes. Document Released: 01/11/2001 Document Revised: 01/22/2013 Document Reviewed: 03/20/2012 Exit[employer] Patient Information 2015 [employer]. This information is not intended to replace advice given to you by your health care provider. Make sure you discuss any questions you have with your health care provider.  

## 2013-10-29 NOTE — Progress Notes (Signed)
Pt reports some pelvic pressure, pain and UC's.  Weekly US @ [facility] on Fridays.  IOL scheduled 10/7.

## 2013-10-29 NOTE — Progress Notes (Signed)
Doing well. Brought book today.  FBS low 100s. Other 2 hr PCs below 120.  Mostly 105-110.  NST reactive today. No contractions. Has BPP and NST scheduled for Friday. IOL is scheduled for 11/06/13.

## 2013-11-01 ENCOUNTER — Ambulatory Visit (HOSPITAL_COMMUNITY)
Admission: RE | Admit: 2013-11-01 | Discharge: 2013-11-01 | Disposition: A | Discharge status: Home / Self Care | Payer: Medicaid Other | Source: Ambulatory Visit | Attending: Obstetrics & Gynecology | Admitting: Obstetrics & Gynecology

## 2013-11-01 DIAGNOSIS — O24113 Pre-existing diabetes mellitus, type 2, in pregnancy, third trimester: Secondary | ICD-10-CM

## 2013-11-01 DIAGNOSIS — O99210 Obesity complicating pregnancy, unspecified trimester: Secondary | ICD-10-CM

## 2013-11-01 DIAGNOSIS — O289 Unspecified abnormal findings on antenatal screening of mother: Secondary | ICD-10-CM

## 2013-11-01 DIAGNOSIS — O99340 Other mental disorders complicating pregnancy, unspecified trimester: Secondary | ICD-10-CM

## 2013-11-01 DIAGNOSIS — O24919 Unspecified diabetes mellitus in pregnancy, unspecified trimester: Secondary | ICD-10-CM

## 2013-11-01 DIAGNOSIS — E669 Obesity, unspecified: Secondary | ICD-10-CM

## 2013-11-01 DIAGNOSIS — O09513 Supervision of elderly primigravida, third trimester: Secondary | ICD-10-CM

## 2013-11-01 DIAGNOSIS — O99213 Obesity complicating pregnancy, third trimester: Secondary | ICD-10-CM

## 2013-11-01 DIAGNOSIS — O10019 Pre-existing essential hypertension complicating pregnancy, unspecified trimester: Secondary | ICD-10-CM

## 2013-11-01 DIAGNOSIS — O24313 Unspecified pre-existing diabetes mellitus in pregnancy, third trimester: Secondary | ICD-10-CM

## 2013-11-01 DIAGNOSIS — O10913 Unspecified pre-existing hypertension complicating pregnancy, third trimester: Secondary | ICD-10-CM

## 2013-11-06 ENCOUNTER — Inpatient Hospital Stay (HOSPITAL_COMMUNITY): Payer: Medicaid Other

## 2013-11-06 ENCOUNTER — Inpatient Hospital Stay (HOSPITAL_COMMUNITY)
Admission: RE | Admit: 2013-11-06 | Discharge: 2013-11-09 | DRG: 774 | Disposition: A | Discharge status: Home / Self Care | Payer: Medicaid Other | Source: Ambulatory Visit | Attending: Obstetrics & Gynecology | Admitting: Obstetrics & Gynecology

## 2013-11-06 ENCOUNTER — Encounter (HOSPITAL_COMMUNITY): Payer: Self-pay

## 2013-11-06 VITALS — BP 117/59 | HR 73 | Temp 97.9°F | Resp 18 | Ht 72.0 in | Wt 280.0 lb

## 2013-11-06 DIAGNOSIS — O1002 Pre-existing essential hypertension complicating childbirth: Secondary | ICD-10-CM | POA: Diagnosis present

## 2013-11-06 DIAGNOSIS — O09513 Supervision of elderly primigravida, third trimester: Secondary | ICD-10-CM | POA: Diagnosis not present

## 2013-11-06 DIAGNOSIS — O24313 Unspecified pre-existing diabetes mellitus in pregnancy, third trimester: Secondary | ICD-10-CM

## 2013-11-06 DIAGNOSIS — O24419 Gestational diabetes mellitus in pregnancy, unspecified control: Secondary | ICD-10-CM | POA: Diagnosis present

## 2013-11-06 DIAGNOSIS — Z6838 Body mass index (BMI) 38.0-38.9, adult: Secondary | ICD-10-CM | POA: Diagnosis not present

## 2013-11-06 DIAGNOSIS — O99334 Smoking (tobacco) complicating childbirth: Secondary | ICD-10-CM | POA: Diagnosis present

## 2013-11-06 DIAGNOSIS — O24420 Gestational diabetes mellitus in childbirth, diet controlled: Secondary | ICD-10-CM | POA: Diagnosis present

## 2013-11-06 DIAGNOSIS — O99213 Obesity complicating pregnancy, third trimester: Secondary | ICD-10-CM

## 2013-11-06 DIAGNOSIS — O2412 Pre-existing diabetes mellitus, type 2, in childbirth: Secondary | ICD-10-CM | POA: Diagnosis not present

## 2013-11-06 DIAGNOSIS — Z3A39 39 weeks gestation of pregnancy: Secondary | ICD-10-CM | POA: Diagnosis present

## 2013-11-06 DIAGNOSIS — O99214 Obesity complicating childbirth: Secondary | ICD-10-CM | POA: Diagnosis present

## 2013-11-06 DIAGNOSIS — Z833 Family history of diabetes mellitus: Secondary | ICD-10-CM

## 2013-11-06 LAB — CBC
HEMATOCRIT: 38.6 % (ref 36.0–46.0)
Hemoglobin: 13.2 g/dL (ref 12.0–15.0)
MCH: 30.8 pg (ref 26.0–34.0)
MCHC: 34.2 g/dL (ref 30.0–36.0)
MCV: 90.2 fL (ref 78.0–100.0)
PLATELETS: 256 10*3/uL (ref 150–400)
RBC: 4.28 MIL/uL (ref 3.87–5.11)
RDW: 14.9 % (ref 11.5–15.5)
WBC: 11.9 10*3/uL — AB (ref 4.0–10.5)

## 2013-11-06 LAB — ABO/RH: ABO/RH(D): A POS

## 2013-11-06 LAB — GLUCOSE, CAPILLARY
Glucose-Capillary: 98 mg/dL (ref 70–99)
Glucose-Capillary: 85 mg/dL (ref 70–99)
Glucose-Capillary: 90 mg/dL (ref 70–99)
Glucose-Capillary: 109 mg/dL — ABNORMAL HIGH (ref 70–99)

## 2013-11-06 LAB — TYPE AND SCREEN
ABO/RH(D): A POS
ANTIBODY SCREEN: NEGATIVE

## 2013-11-06 LAB — RPR

## 2013-11-06 LAB — HIV ANTIBODY (ROUTINE TESTING W REFLEX): HIV: NONREACTIVE

## 2013-11-06 MED ORDER — TERBUTALINE SULFATE 1 MG/ML IJ SOLN: 0.2500 mg | Freq: Once | INTRAMUSCULAR | Status: AC | PRN

## 2013-11-06 MED ORDER — OXYTOCIN 40 UNITS IN LACTATED RINGERS INFUSION - SIMPLE MED: 62.5000 mL/h | INTRAVENOUS | Status: DC

## 2013-11-06 MED ORDER — LIDOCAINE HCL (PF) 1 % IJ SOLN
30.0000 mL | INTRAMUSCULAR | Status: DC | PRN
  Filled 2013-11-06: qty 30

## 2013-11-06 MED ORDER — NICOTINE 7 MG/24HR TD PT24
7.0000 mg | MEDICATED_PATCH | Freq: Every day | TRANSDERMAL | Status: DC
  Administered 2013-11-06: 7 mg via TRANSDERMAL
  Filled 2013-11-06 (×3): qty 1

## 2013-11-06 MED ORDER — MISOPROSTOL 25 MCG QUARTER TABLET
25.0000 ug | ORAL_TABLET | ORAL | Status: DC | PRN
  Administered 2013-11-06 – 2013-11-07 (×4): 25 ug via VAGINAL
  Filled 2013-11-06 (×2): qty 0.25
  Filled 2013-11-06: qty 1
  Filled 2013-11-06 (×2): qty 0.25

## 2013-11-06 MED ORDER — LACTATED RINGERS IV SOLN
INTRAVENOUS | Status: DC
Start: 2013-11-06 — End: 2013-11-08
  Administered 2013-11-06 – 2013-11-07 (×5): via INTRAVENOUS

## 2013-11-06 MED ORDER — ONDANSETRON HCL 4 MG/2ML IJ SOLN
4.0000 mg | Freq: Four times a day (QID) | INTRAMUSCULAR | Status: DC | PRN
  Administered 2013-11-07: 4 mg via INTRAVENOUS
  Filled 2013-11-06: qty 2

## 2013-11-06 MED ORDER — FENTANYL CITRATE 0.05 MG/ML IJ SOLN
100.0000 ug | INTRAMUSCULAR | Status: DC | PRN
  Administered 2013-11-06 – 2013-11-07 (×5): 100 ug via INTRAVENOUS
  Filled 2013-11-06 (×5): qty 2

## 2013-11-06 MED ORDER — CITRIC ACID-SODIUM CITRATE 334-500 MG/5ML PO SOLN
30.0000 mL | ORAL | Status: DC | PRN
  Administered 2013-11-06 – 2013-11-07 (×3): 30 mL via ORAL
  Filled 2013-11-06 (×3): qty 15

## 2013-11-06 MED ORDER — LACTATED RINGERS IV SOLN
500.0000 mL | INTRAVENOUS | Status: DC | PRN
  Administered 2013-11-07: 500 mL via INTRAVENOUS

## 2013-11-06 MED ORDER — OXYCODONE-ACETAMINOPHEN 5-325 MG PO TABS: 2.0000 | ORAL_TABLET | ORAL | Status: DC | PRN

## 2013-11-06 MED ORDER — OXYCODONE-ACETAMINOPHEN 5-325 MG PO TABS: 1.0000 | ORAL_TABLET | ORAL | Status: DC | PRN

## 2013-11-06 MED ORDER — ACETAMINOPHEN 325 MG PO TABS
650.0000 mg | ORAL_TABLET | ORAL | Status: DC | PRN
Start: 2013-11-06 — End: 2013-11-08
  Filled 2013-11-06: qty 2

## 2013-11-06 MED ORDER — OXYTOCIN BOLUS FROM INFUSION
500.0000 mL | INTRAVENOUS | Status: DC
  Administered 2013-11-07: 500 mL via INTRAVENOUS

## 2013-11-06 NOTE — H&P (Signed)
LABOR ADMISSION HISTORY AND PHYSICAL  Colleen Wiggins is a 38 y.o. female G1P0 with IUP at [redacted]w[redacted]d by redating 9 week sono presenting for IOL 2/2 A2/BDM. She reports +FMs, No LOF, no VB, no blurry vision, headaches or peripheral edema, and RUQ pain. She desires an epidural for labor pain control. She plans on breast and bottle feeding. She unsure of for birth control.   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  Hx of THC use and benzo.  Also +UDS for benzo/cocaine/THC in 2009.  Prenatal History/Complications:  Past Medical History: Past Medical History  Diagnosis Date  . Medical history non-contributory   . Gestational diabetes     Past Surgical History: Past Surgical History  Procedure Laterality Date  . Appendectomy    . Tonsillectomy      Obstetrical History: OB History   Grav Para Term Preterm Abortions TAB SAB Ect Mult Living   1         0     Social History: History   Social History  . Marital Status: Single    Spouse Name: N/A    Number of Children: N/A  . Years of Education: N/A   Social History Main Topics  . Smoking status: Current Every Day Smoker -- 0.50 packs/day  . Smokeless tobacco: None  . Alcohol Use: No  . Drug Use: No  . Sexual Activity: Yes    Birth Control/ Protection: None   Other Topics Concern  . None   Social History Narrative  . None    Family History: Family History  Problem Relation Age of Onset  . Diabetes Father   . Diabetes Paternal Grandmother     Allergies: No Known Allergies  Prescriptions prior to admission  Medication Sig Dispense Refill  . Calcium Carbonate Antacid (ALKA-MINTS) 850 MG CHEW Chew by mouth.      . glyBURIDE (DIABETA) 5 MG tablet Take 2 tablets (10 mg total) by mouth 2 (two) times daily.  120 tablet  3  . Prenatal Vit-Fe Fumarate-FA (PRENATAL  MULTIVITAMIN) TABS tablet Take 1 tablet by mouth daily at 12 noon.      Marland Kitchen ACCU-CHEK FASTCLIX LANCETS MISC Inject 1 each into the skin 4 (four) times daily. 161.09 for testing 4 times daily  102 each  12  . glucose blood (ACCU-CHEK ACTIVE STRIPS) test strip Use as instructed  1 each  12     Review of Systems   All systems reviewed and negative except as stated in HPI  Blood pressure 126/76, pulse 93, temperature 97.8 F (36.6 C), temperature source Oral, resp. rate 18, height 6' (1.829 m), weight 280 lb (127.007 kg), last menstrual period 01/27/2013. General appearance: alert and cooperative Lungs: clear to auscultation bilaterally Heart: regular rate and rhythm Abdomen: soft, non-tender; bowel sounds normal Pelvic: excess adipose tissue Extremities: Homans sign is negative, no sign of DVT Presentation: cephalic, confirmed with ultrasound by Dr. Feliz Beam and myself Fetal monitoringBaseline: 150 bpm, Variability: Good {> 6 bpm), Accelerations: Reactive and Decelerations: Absent Uterine activityNone Dilation: Closed Effacement (%): Thick Station: -3 Exam by:: Dr. Loreta Ave   Prenatal labs: ABO, Rh: A/POS/-- (03/10 1122) Antibody: NEG (03/10 1122) Rubella:   RPR: NON REAC (07/20 1222)  HBsAg: NEGATIVE (03/10 1122)  HIV: NONREACTIVE (09/15 1329)  GBS: Negative (09/15 0000)  3 hr Glucola 97/197/148/111 Genetic screening  Elevated ROD, declined genetic counseling Anatomy US normal   Clinic HRC  Dating Dating is by 9wks   Genetic Screen Quad:  Elevated ROD 1:195 [x]  offer genetic counseling- declined  Anatomic US Normal- still needs f/u growth in 4 weeks  GTT  GDMA/B  TDaP vaccine  July 2015  Flu vaccine declines  GBS Neg   Contraception  None  Baby Food  Breast  Pediatrician  undecided  Support Person Husband      Results for orders placed during the hospital encounter of 11/06/13 (from the past 24 hour(s))  CBC   Collection Time    11/06/13  8:05 AM      Result  Value Ref Range   WBC 11.9 (*) 4.0 - 10.5 K/uL   RBC 4.28  3.87 - 5.11 MIL/uL   Hemoglobin 13.2  12.0 - 15.0 g/dL   HCT 16.138.6  09.636.0 - 04.546.0 %   MCV 90.2  78.0 - 100.0 fL   MCH 30.8  26.0 - 34.0 pg   MCHC 34.2  30.0 - 36.0 g/dL   RDW 40.914.9  81.111.5 - 91.415.5 %   Platelets 256  150 - 400 K/uL  GLUCOSE, CAPILLARY   Collection Time    11/06/13 10:05 AM      Result Value Ref Range   Glucose-Capillary 98  70 - 99 mg/dL    Patient Active Problem List   Diagnosis Date Noted  . Gestational diabetes 11/06/2013  . Chronic hypertension complicating pregnancy, antepartum 06/10/2013  . Obesity in pregnancy, antepartum 06/10/2013  . Class A2/B diabetes complicating pregnancy, antepartum 05/30/2013  . Supervision of high-risk pregnancy of elderly primigravida 05/06/2013  . Advanced maternal age, primigravida, antepartum 05/06/2013    Assessment: Colleen Orson AloeHenderson is a 38 y.o. G1P0 at 2937w0d here for IOL 2/2 A2/BDM on glyburide  #Labor:cytotec placed, will place FB when able #Pain: Fentanyl prn #FWB: Cat I #ID:  GBS neg #MOF: bottle #MOC:undecided #Circ:  N/a, female - social work consult postpartum A2/BDM: q4h CBG early latent, q1-2h active labor  Colleen Wiggins 11/06/2013, 10:41 AM

## 2013-11-06 NOTE — Progress Notes (Signed)
Colleen Wiggins is a 38 y.o. G1P0 at 6162w0d  Subjective:  Pt doing well. No complaints. cytotec at 8pm, next dose at 12 am.  Objective: BP 129/78  Pulse 82  Temp(Src) 98.4 F (36.9 C) (Oral)  Resp 18  Ht 6' (1.829 m)  Wt 127.007 kg (280 lb)  BMI 37.97 kg/m2  LMP 01/27/2013      FHT:  FHR: 135 bpm, variability: moderate,  accelerations:  Present,  decelerations:  Absent UC:   irregular, every 3-6 minutes SVE:   Dilation: Closed Effacement (%): Thick Station: -3 Exam by:: Dr. Standley BrookingFerguseon  Labs: Lab Results  Component Value Date   WBC 11.9* 11/06/2013   HGB 13.2 11/06/2013   HCT 38.6 11/06/2013   MCV 90.2 11/06/2013   PLT 256 11/06/2013    Assessment / Plan: Augmentation of labor, progressing well  Labor: serial cytotec dosing q4h Preeclampsia:  no signs or symptoms of toxicity Fetal Wellbeing:  Category I Pain Control:  Fentanyl I/D:  n/a Anticipated MOD:  NSVD  Colleen Wiggins, Colleen Wiggins 11/06/2013, 9:15 PM

## 2013-11-06 NOTE — Progress Notes (Signed)
I have seen and examined this patient and I agree with the above. SHAW, KIMBERLY CNM 10:55 PM 11/06/2013    

## 2013-11-06 NOTE — Progress Notes (Signed)
Steele Orson AloeHenderson is a 38 y.o. G1P0 at 251w0d by ultrasound admitted for induction of labor due toj gest DM on Glyburide.  U/s showed  Growth at 52%ile at 36 wk.  Subjective: Has been uncomfortable with cytotec , contracting sufficiently that she cannot get her cytotec dosing q4h, as it has been approx q 6h today. Foley Bulb attempted x2 by jvf without success.  Objective: BP 129/78  Pulse 82  Temp(Src) 98.4 F (36.9 C) (Oral)  Resp 18  Ht 6' (1.829 m)  Wt 280 lb (127.007 kg)  BMI 37.97 kg/m2  LMP 01/27/2013      FHT:  FHR: 145 bpm, variability: moderate,  accelerations:  Present,  decelerations:  Absent UC:   irregular, every 3-5 minutes SVE:   Dilation: Closed Effacement (%): Thick Station: -3 Exam by:: Dr. Standley BrookingFerguseon EFFORT TO PLACE FOLEY BULB X 2 ATTEMPTED, BLIND WITH RING FORCEPS , THEN SPECULUM VISUALIZATION OF CERVIX, AND NEITHER EFFORT SUCCESSFUL PT HAS POOR TOLERANCE OF THE DISCOMFORT OF FOLEY BULB EFFORTS.  Labs: Lab Results  Component Value Date   WBC 11.9* 11/06/2013   HGB 13.2 11/06/2013   HCT 38.6 11/06/2013   MCV 90.2 11/06/2013   PLT 256 11/06/2013    Assessment / Plan: IOL DUE TO gest dm on glyburide, with no significant cervical change to date. Will continue cytotec q4h thru the night. 3rd cytotec placed by me at 8:04 pm.  Labor: not in labor Preeclampsia:   Fetal Wellbeing:  Category I Pain Control:  Labor support without medications I/D:  n/a Anticipated MOD:  NSVD  Maddix Heinz V 11/06/2013, 8:45 PM

## 2013-11-06 NOTE — H&P (Signed)
Attestation of Attending Supervision of Fellow: Evaluation and management procedures were performed by the Fellow under my supervision and collaboration.  I have reviewed the Fellow's note and chart, and I agree with the management and plan.    

## 2013-11-07 ENCOUNTER — Inpatient Hospital Stay (HOSPITAL_COMMUNITY): Payer: Medicaid Other | Admitting: Anesthesiology

## 2013-11-07 ENCOUNTER — Encounter (HOSPITAL_COMMUNITY): Payer: Medicaid Other | Admitting: Anesthesiology

## 2013-11-07 ENCOUNTER — Encounter (HOSPITAL_COMMUNITY): Payer: Self-pay

## 2013-11-07 DIAGNOSIS — Z3A39 39 weeks gestation of pregnancy: Secondary | ICD-10-CM

## 2013-11-07 DIAGNOSIS — O2412 Pre-existing diabetes mellitus, type 2, in childbirth: Secondary | ICD-10-CM

## 2013-11-07 DIAGNOSIS — E119 Type 2 diabetes mellitus without complications: Secondary | ICD-10-CM

## 2013-11-07 LAB — GLUCOSE, CAPILLARY
GLUCOSE-CAPILLARY: 117 mg/dL — AB (ref 70–99)
GLUCOSE-CAPILLARY: 131 mg/dL — AB (ref 70–99)
GLUCOSE-CAPILLARY: 106 mg/dL — AB (ref 70–99)
Glucose-Capillary: 113 mg/dL — ABNORMAL HIGH (ref 70–99)
Glucose-Capillary: 91 mg/dL (ref 70–99)

## 2013-11-07 MED ORDER — OXYTOCIN 40 UNITS IN LACTATED RINGERS INFUSION - SIMPLE MED
1.0000 m[IU]/min | INTRAVENOUS | Status: DC
  Administered 2013-11-07: 2 m[IU]/min via INTRAVENOUS
  Filled 2013-11-07: qty 1000

## 2013-11-07 MED ORDER — EPHEDRINE 5 MG/ML INJ
10.0000 mg | INTRAVENOUS | Status: DC | PRN
  Filled 2013-11-07: qty 2

## 2013-11-07 MED ORDER — NICOTINE 21 MG/24HR TD PT24: 21.0000 mg | MEDICATED_PATCH | Freq: Every day | TRANSDERMAL | Status: DC

## 2013-11-07 MED ORDER — PHENYLEPHRINE 40 MCG/ML (10ML) SYRINGE FOR IV PUSH (FOR BLOOD PRESSURE SUPPORT)
80.0000 ug | PREFILLED_SYRINGE | INTRAVENOUS | Status: DC | PRN
  Filled 2013-11-07: qty 2
  Filled 2013-11-07: qty 10

## 2013-11-07 MED ORDER — TERBUTALINE SULFATE 1 MG/ML IJ SOLN
0.2500 mg | Freq: Once | INTRAMUSCULAR | Status: AC
  Administered 2013-11-07: 0.25 mg via SUBCUTANEOUS

## 2013-11-07 MED ORDER — TERBUTALINE SULFATE 1 MG/ML IJ SOLN
INTRAMUSCULAR | Status: AC
  Filled 2013-11-07: qty 1

## 2013-11-07 MED ORDER — ZOLPIDEM TARTRATE 5 MG PO TABS
5.0000 mg | ORAL_TABLET | Freq: Every evening | ORAL | Status: DC | PRN
  Administered 2013-11-07: 5 mg via ORAL
  Filled 2013-11-07: qty 1

## 2013-11-07 MED ORDER — FAMOTIDINE IN NACL 20-0.9 MG/50ML-% IV SOLN
20.0000 mg | Freq: Two times a day (BID) | INTRAVENOUS | Status: DC
  Administered 2013-11-07: 20 mg via INTRAVENOUS
  Filled 2013-11-07 (×3): qty 50

## 2013-11-07 MED ORDER — PHENYLEPHRINE 40 MCG/ML (10ML) SYRINGE FOR IV PUSH (FOR BLOOD PRESSURE SUPPORT)
80.0000 ug | PREFILLED_SYRINGE | INTRAVENOUS | Status: DC | PRN
  Filled 2013-11-07: qty 2

## 2013-11-07 MED ORDER — NICOTINE 14 MG/24HR TD PT24
14.0000 mg | MEDICATED_PATCH | Freq: Every day | TRANSDERMAL | Status: DC
  Administered 2013-11-07: 14 mg via TRANSDERMAL
  Filled 2013-11-07: qty 1

## 2013-11-07 MED ORDER — LACTATED RINGERS IV SOLN
500.0000 mL | Freq: Once | INTRAVENOUS | Status: AC
  Administered 2013-11-07: 500 mL via INTRAVENOUS

## 2013-11-07 MED ORDER — FENTANYL 2.5 MCG/ML BUPIVACAINE 1/10 % EPIDURAL INFUSION (WH - ANES)
INTRAMUSCULAR | Status: DC | PRN
  Administered 2013-11-07: 14 mL/h via EPIDURAL

## 2013-11-07 MED ORDER — LIDOCAINE HCL (PF) 1 % IJ SOLN
INTRAMUSCULAR | Status: DC | PRN
  Administered 2013-11-07 (×2): 4 mL

## 2013-11-07 MED ORDER — LACTATED RINGERS IV SOLN
INTRAVENOUS | Status: DC
  Administered 2013-11-07: 300 mL via INTRAUTERINE

## 2013-11-07 MED ORDER — DIPHENHYDRAMINE HCL 50 MG/ML IJ SOLN: 12.5000 mg | INTRAMUSCULAR | Status: DC | PRN

## 2013-11-07 MED ORDER — FENTANYL 2.5 MCG/ML BUPIVACAINE 1/10 % EPIDURAL INFUSION (WH - ANES)
14.0000 mL/h | INTRAMUSCULAR | Status: DC | PRN
  Administered 2013-11-07 (×4): 14 mL/h via EPIDURAL
  Filled 2013-11-07 (×4): qty 125

## 2013-11-07 NOTE — Progress Notes (Signed)
`````  Attestation of Attending Supervision of Advanced Practitioner: Evaluation and management procedures were performed by the PA/NP/CNM/OB Fellow under my supervision/collaboration. Chart reviewed and agree with management and plan.  Nhung Danko V 11/07/2013 5:10 AM

## 2013-11-07 NOTE — Progress Notes (Signed)
Colleen Wiggins is a 38 y.o. G1P0 at 8381w1d by ultrasound admitted for induction of labor due to Gestational diabetes.  Subjective:  No c/o offered.   Objective: BP 129/78  Pulse 82  Temp(Src) 98.4 F (36.9 C) (Oral)  Resp 18  Ht 6' (1.829 m)  Wt 127.007 kg (280 lb)  BMI 37.97 kg/m2  LMP 01/27/2013      FHT:  FHR: 135 bpm, variability: moderate,  accelerations:  Present,  decelerations:  Absent UC:   irregular, every 5-6 minutes SVE:   Dilation: Closed Effacement (%): Thick Station: -3 Exam by:: Colleen Wiggins, SNM  Labs: Lab Results  Component Value Date   WBC 11.9* 11/06/2013   HGB 13.2 11/06/2013   HCT 38.6 11/06/2013   MCV 90.2 11/06/2013   PLT 256 11/06/2013    Assessment / Plan: Induction of labor due to gestational diabetes,  progressing well on pitocin  Labor: Scheduled dose of Cytotec placed vaginally Preeclampsia:  no signs or symptoms of toxicity and labs stable Fetal Wellbeing:  Category I Pain Control:  Fentanyl I/D:  n/a Anticipated MOD:  NSVD   Colleen Wiggins 11/07/2013, 12:28 AM

## 2013-11-07 NOTE — Progress Notes (Signed)
Patient ID: Colleen BoundChevon Wiggins, female   DOB: 1975/02/18, 38 y.o.   MRN: [id number] Colleen Wiggins is a 38 y.o. G1P0 at 2446w1d.  Subjective: Comfortable w/ epidural  Objective: BP 113/54  Pulse 90  Temp(Src) 98.3 F (36.8 C) (Oral)  Resp 20  Ht 6' (1.829 m)  Wt 127.007 kg (280 lb)  BMI 37.97 kg/m2  SpO2 98%  LMP 01/27/2013   FHT:  FHR: 150 bpm, variability: moderate,  accelerations:  15x15,  decelerations:  Few variable UC:   Q 6-8 minutes, mild  Dilation: 3 (foley bulb intact) Effacement (%): 20 Cervical Position: Posterior Station: -3 Presentation: Vertex Exam by:: Dr Emelda FearFerguson  Labs: Results for orders placed during the hospital encounter of 11/06/13 (from the past 24 hour(s))  GLUCOSE, CAPILLARY     Status: None   Collection Time    11/06/13  1:54 PM      Result Value Ref Range   Glucose-Capillary 85  70 - 99 mg/dL  GLUCOSE, CAPILLARY     Status: None   Collection Time    11/06/13  6:12 PM      Result Value Ref Range   Glucose-Capillary 90  70 - 99 mg/dL  GLUCOSE, CAPILLARY     Status: Abnormal   Collection Time    11/06/13 10:51 PM      Result Value Ref Range   Glucose-Capillary 109 (*) 70 - 99 mg/dL  GLUCOSE, CAPILLARY     Status: Abnormal   Collection Time    11/07/13  2:45 AM      Result Value Ref Range   Glucose-Capillary 117 (*) 70 - 99 mg/dL  GLUCOSE, CAPILLARY     Status: Abnormal   Collection Time    11/07/13  6:26 AM      Result Value Ref Range   Glucose-Capillary 131 (*) 70 - 99 mg/dL    Assessment / Plan: 7446w1d week IUP Labor: IOL Fetal Wellbeing:  Category I-II Pain Control:  Epidural Anticipated MOD:  NSVD  Colleen Wiggins, CNM 11/07/2013 11:15 AM

## 2013-11-07 NOTE — Progress Notes (Signed)
   Colleen Wiggins is a 38 y.o. G1P0 at 7168w1d  admitted for induction of labor due to Gestational diabetes.  Subjective: C/o rectal pressure with contractions  Objective: Filed Vitals:   11/07/13 1801 11/07/13 1831 11/07/13 1901 11/07/13 1931  BP: 150/91 147/89 147/90 148/80  Pulse: 81 86 96 88  Temp:   99 F (37.2 C)   TempSrc:   Oral   Resp:  20 20   Height:      Weight:      SpO2:          FHT:  FHR: 150 bpm, variability: moderate,  accelerations:  Present,  decelerations:  Absent UC:   q 3-4, MVU's ~ 150 SVE:   Dilation: 7 Effacement (%): 90 Station: -2 Exam by:: Drenda FreezeFran  DishmonCNM Pitocin @ 10 mu/min  Labs: Lab Results  Component Value Date   WBC 11.9* 11/06/2013   HGB 13.2 11/06/2013   HCT 38.6 11/06/2013   MCV 90.2 11/06/2013   PLT 256 11/06/2013    Assessment / Plan: IOL for A2DM, inadequate labor now Will increase pitocin; IUPC replaced (wasn't monitoring correctly), amnioinfusion D/C'd Labor: progressing, slow Fetal Wellbeing:  Category I Pain Control:  Epidural Anticipated MOD:  NSVD  CRESENZO-DISHMAN,Lauryn Lizardi 11/07/2013, 7:34 PM

## 2013-11-07 NOTE — Progress Notes (Signed)
Patient ID: Koren BoundChevon Hergert, female   DOB: Dec 23, 1975, 38 y.o.   MRN: [id number] Koren BoundChevon Notte is a 38 y.o. G1P0 at 7845w1d.  Subjective: Comfortable w/ epidural. C/O HA. Requesting food.   Objective: BP 123/73  Pulse 83  Temp(Src) 97.7 F (36.5 C) (Oral)  Resp 18  Ht 6' (1.829 m)  Wt 127.007 kg (280 lb)  BMI 37.97 kg/m2  SpO2 98%  LMP 01/27/2013   FHT:  FHR: 145 bpm, variability: moderate,  accelerations:  15x15,  decelerations: Periods or repetitive mild-mod variables. Improve w/ position changes, then return.  UC:   Q 5-6, minutes, mod-strong  Dilation: 5 Effacement (%): 90 Cervical Position: Posterior Station: -2 Presentation: Vertex Exam by:: Dr. Alessandra Groutlark/ Jhaden Pizzuto, CNM  Labs: Results for orders placed during the hospital encounter of 11/06/13 (from the past 24 hour(s))  GLUCOSE, CAPILLARY     Status: None   Collection Time    11/06/13  6:12 PM      Result Value Ref Range   Glucose-Capillary 90  70 - 99 mg/dL  GLUCOSE, CAPILLARY     Status: Abnormal   Collection Time    11/06/13 10:51 PM      Result Value Ref Range   Glucose-Capillary 109 (*) 70 - 99 mg/dL  GLUCOSE, CAPILLARY     Status: Abnormal   Collection Time    11/07/13  2:45 AM      Result Value Ref Range   Glucose-Capillary 117 (*) 70 - 99 mg/dL  GLUCOSE, CAPILLARY     Status: Abnormal   Collection Time    11/07/13  6:26 AM      Result Value Ref Range   Glucose-Capillary 131 (*) 70 - 99 mg/dL  GLUCOSE, CAPILLARY     Status: Abnormal   Collection Time    11/07/13 11:31 AM      Result Value Ref Range   Glucose-Capillary 106 (*) 70 - 99 mg/dL  GLUCOSE, CAPILLARY     Status: Abnormal   Collection Time    11/07/13  2:57 PM      Result Value Ref Range   Glucose-Capillary 113 (*) 70 - 99 mg/dL    Assessment / Plan: 6245w1d week IUP Labor: Early Fetal Wellbeing:  Category II, but overall reassuring stus Pain Control:  Epidural Anticipated MOD:  Discussed w/ pt concern for possible need for C/S for  either fetal intolerance of labor or protracted latent or active phase if unable to increase pitocin.  Amnioinfusion  Dr. Erin FullingHarraway-Rosmarie Esquibel updated  Dorathy KinsmanVirginia Huey Scalia, CNM 11/07/2013 3:42 PM

## 2013-11-07 NOTE — Anesthesia Preprocedure Evaluation (Signed)
Anesthesia Evaluation  Patient identified by MRN, date of birth, ID band Patient awake    Reviewed: Allergy & Precautions, H&P , Patient's Chart, lab work & pertinent test results  Airway Mallampati: III TM Distance: >3 FB Neck ROM: Full    Dental no notable dental hx. (+) Teeth Intact   Pulmonary Current Smoker,  breath sounds clear to auscultation  Pulmonary exam normal       Cardiovascular hypertension, Rhythm:Regular Rate:Normal     Neuro/Psych negative neurological ROS  negative psych ROS   GI/Hepatic negative GI ROS, Neg liver ROS,   Endo/Other  diabetes, Well Controlled, Gestational, Oral Hypoglycemic AgentsMorbid obesity  Renal/GU negative Renal ROS  negative genitourinary   Musculoskeletal negative musculoskeletal ROS (+)   Abdominal (+) + obese,   Peds  Hematology negative hematology ROS (+)   Anesthesia Other Findings   Reproductive/Obstetrics (+) Pregnancy                           Anesthesia Physical Anesthesia Plan  ASA: III  Anesthesia Plan: Epidural   Post-op Pain Management:    Induction:   Airway Management Planned: Natural Airway  Additional Equipment:   Intra-op Plan:   Post-operative Plan:   Informed Consent: I have reviewed the patients History and Physical, chart, labs and discussed the procedure including the risks, benefits and alternatives for the proposed anesthesia with the patient or authorized representative who has indicated his/her understanding and acceptance.     Plan Discussed with: Anesthesiologist  Anesthesia Plan Comments:         Anesthesia Quick Evaluation

## 2013-11-07 NOTE — Progress Notes (Signed)
Colleen Wiggins is a 38 y.o. G1P0 at 4941w1d by 9wk ultrasound admitted for induction of labor due to Gestational diabetes.  Subjective: Feels some urge to push with contractions  Objective: BP 123/72  Pulse 97  Temp(Src) 99.9 F (37.7 C) (Oral)  Resp 18  Ht 6' (1.829 m)  Wt 127.007 kg (280 lb)  BMI 37.97 kg/m2  SpO2 98%  LMP 01/27/2013      FHT:  FHR: 160 bpm, variability: moderate,  accelerations:  Present,  decelerations:  Present variables UC:   regular, every 3.5 minutes, duration 90s SVE:   Dilation: 10 Effacement (%): 100 Station: +1 Exam by:: F. Dishmon-Cresenzo, CNM  Labs: Lab Results  Component Value Date   WBC 11.9* 11/06/2013   HGB 13.2 11/06/2013   HCT 38.6 11/06/2013   MCV 90.2 11/06/2013   PLT 256 11/06/2013    Assessment / Plan: Induction of labor due to gestational diabetes,  Cervix dilation complete. Will let labor down. If variables worsen will restart amnioinfusion  Labor: complete, laboring down Preeclampsia:  no signs or symptoms of toxicity Fetal Wellbeing:  Category II Pain Control:  Epidural I/D:  n/a Anticipated MOD:  NSVD  Colleen Wiggins, Colleen Wiggins 11/07/2013, 9:28 PM

## 2013-11-07 NOTE — Progress Notes (Signed)
Colleen Wiggins is a 38 y.o. G1P0 at 3929w1d by ultrasound admitted for induction of labor due to Gestational diabetes.   Subjective: Pt has had a series of decelerations , late in character, with continued beat to beat throughout. Pt was given terbutaline at 3:20,  With cessation of uterine contractions, and initial tachycardia, but FHR returns to Cat I.SROM at 1 am. Foley Bulb inserted   Objective: BP 131/72  Pulse 80  Temp(Src) 97.9 F (36.6 C) (Oral)  Resp 18  Ht 6' (1.829 m)  Wt 127.007 kg (280 lb)  BMI 37.97 kg/m2  SpO2 98%  LMP 01/27/2013      FHT:  FHR: 155 bpm, variability: moderate,  accelerations:  Present,  decelerations:  Absent UC:   none SVE:   Dilation: 1.5 Effacement (%): 20 Station: -3 Exam by:: Dr Clearence PedFurguson  Labs: Lab Results  Component Value Date   WBC 11.9* 11/06/2013   HGB 13.2 11/06/2013   HCT 38.6 11/06/2013   MCV 90.2 11/06/2013   PLT 256 11/06/2013    Assessment / Plan: Induction of labor for Gest DM A2, glyburide, s/p Cat II FHR , now without contractions since terbutalene.   Labor: Foley bulb inserted, will start oxytocin in 4 hours Preeclampsia:   Fetal Wellbeing:  Category I Pain Control:  Epidural I/D:  n/a Anticipated MOD:  uncertain prognosis for vag del  Shanequa Whitenight V 11/07/2013, 5:11 AM

## 2013-11-07 NOTE — Anesthesia Procedure Notes (Signed)
Epidural Patient location during procedure: OB Start time: 11/07/2013 2:13 AM  Staffing Anesthesiologist: Saranda Legrande A. Performed by: anesthesiologist   Preanesthetic Checklist Completed: patient identified, site marked, surgical consent, pre-op evaluation, timeout performed, IV checked, risks and benefits discussed and monitors and equipment checked  Epidural Patient position: sitting Prep: site prepped and draped and DuraPrep Patient monitoring: continuous pulse ox and blood pressure Approach: midline Location: L3-L4 Injection technique: LOR air  Needle:  Needle type: Tuohy  Needle gauge: 17 G Needle length: 9 cm and 9 Needle insertion depth: 7 cm Catheter type: closed end flexible Catheter size: 19 Gauge Catheter at skin depth: 12 cm Test dose: negative and Other  Assessment Events: blood not aspirated, injection not painful, no injection resistance, negative IV test and no paresthesia  Additional Notes Patient identified. Risks and benefits discussed including failed block, incomplete  Pain control, post dural puncture headache, nerve damage, paralysis, blood pressure Changes, nausea, vomiting, reactions to medications-both toxic and allergic and post Partum back pain. All questions were answered. Patient expressed understanding and wished to proceed. Sterile technique was used throughout procedure. Epidural site was Dressed with sterile barrier dressing. No paresthesias, signs of intravascular injection Or signs of intrathecal spread were encountered.  Patient was more comfortable after the epidural was dosed. Please see RN's note for documentation of vital signs and FHR which are stable.

## 2013-11-07 NOTE — Progress Notes (Signed)
Cydne Orson AloeHenderson is a 38 y.o. G1P0 at 649w1d by ultrasound admitted for induction of labor due to Diabetes.  Subjective: CBG 131 CBG (last 3)   Recent Labs  11/06/13 2251 11/07/13 0245 11/07/13 0626  GLUCAP 109* 117* 131*     Objective: BP 119/62  Pulse 95  Temp(Src) 97.9 F (36.6 C) (Oral)  Resp 18  Ht 6' (1.829 m)  Wt 127.007 kg (280 lb)  BMI 37.97 kg/m2  SpO2 98%  LMP 01/27/2013      FHT:  FHR: 145 bpm, variability: moderate,  accelerations:  Present,  decelerations:  Absent UC:   irregular, every 6-10 minutes SVE:   Dilation: 1.5 Effacement (%): 20 Station: -3 Exam by:: Dr Clearence PedFurguson  Labs: Lab Results  Component Value Date   WBC 11.9* 11/06/2013   HGB 13.2 11/06/2013   HCT 38.6 11/06/2013   MCV 90.2 11/06/2013   PLT 256 11/06/2013    Assessment / Plan: Induction of labor due to gestational diabetes,  progressing well on pitocin To begin pitocin at 8 am Labor: begin at 8 am. Preeclampsia:   Fetal Wellbeing:  Category I Pain Control:  Epidural I/D:  n/a Anticipated MOD:  uncertain prognosis. will follow in labor  Derrell Milanes V 11/07/2013, 7:37 AM

## 2013-11-07 NOTE — Progress Notes (Signed)
Patient ID: Colleen Wiggins, female   DOB: May 12, 1975, 38 y.o.   MRN: [id number] Colleen Wiggins is a 38 y.o. G1P0 at 4133w1d.  Subjective: Comfortable w/ epidural.  Objective: BP 126/73  Pulse 77  Temp(Src) 98.3 F (36.8 C) (Axillary)  Resp 18  Ht 6' (1.829 m)  Wt 127.007 kg (280 lb)  BMI 37.97 kg/m2  SpO2 98%  LMP 01/27/2013   FHT:  FHR: 140 bpm, variability: moderate,  accelerations:  15x15,  decelerations:  Few ?lates, variables. Pt flat on back. Lates resolved.  UC:   Q 3-5 minutes, moderate  Dilation: 5 Effacement (%): 90 Cervical Position: Middle Station: -2 Presentation: Vertex Exam by:: SH RNC FB out.   Labs: Results for orders placed during the hospital encounter of 11/06/13 (from the past 24 hour(s))  GLUCOSE, CAPILLARY     Status: None   Collection Time    11/06/13  1:54 PM      Result Value Ref Range   Glucose-Capillary 85  70 - 99 mg/dL  GLUCOSE, CAPILLARY     Status: None   Collection Time    11/06/13  6:12 PM      Result Value Ref Range   Glucose-Capillary 90  70 - 99 mg/dL  GLUCOSE, CAPILLARY     Status: Abnormal   Collection Time    11/06/13 10:51 PM      Result Value Ref Range   Glucose-Capillary 109 (*) 70 - 99 mg/dL  GLUCOSE, CAPILLARY     Status: Abnormal   Collection Time    11/07/13  2:45 AM      Result Value Ref Range   Glucose-Capillary 117 (*) 70 - 99 mg/dL  GLUCOSE, CAPILLARY     Status: Abnormal   Collection Time    11/07/13  6:26 AM      Result Value Ref Range   Glucose-Capillary 131 (*) 70 - 99 mg/dL  GLUCOSE, CAPILLARY     Status: Abnormal   Collection Time    11/07/13 11:31 AM      Result Value Ref Range   Glucose-Capillary 106 (*) 70 - 99 mg/dL    Assessment / Plan: 7733w1d week IUP Labor: early Fetal Wellbeing:  Category II Pain Control:  epidural Anticipated MOD:  NSVD  Dorathy KinsmanVirginia Mykle Pascua, CNM 11/07/2013 1:35 PM

## 2013-11-08 LAB — GLUCOSE, CAPILLARY
Glucose-Capillary: 91 mg/dL (ref 70–99)
Glucose-Capillary: 104 mg/dL — ABNORMAL HIGH (ref 70–99)
Glucose-Capillary: 122 mg/dL — ABNORMAL HIGH (ref 70–99)

## 2013-11-08 MED ORDER — SENNOSIDES-DOCUSATE SODIUM 8.6-50 MG PO TABS
2.0000 | ORAL_TABLET | ORAL | Status: DC
  Administered 2013-11-08 (×2): 2 via ORAL
  Filled 2013-11-08 (×2): qty 2

## 2013-11-08 MED ORDER — ZOLPIDEM TARTRATE 5 MG PO TABS: 5.0000 mg | ORAL_TABLET | Freq: Every evening | ORAL | Status: DC | PRN

## 2013-11-08 MED ORDER — TETANUS-DIPHTH-ACELL PERTUSSIS 5-2.5-18.5 LF-MCG/0.5 IM SUSP: 0.5000 mL | Freq: Once | INTRAMUSCULAR | Status: DC

## 2013-11-08 MED ORDER — WITCH HAZEL-GLYCERIN EX PADS: 1.0000 "application " | MEDICATED_PAD | CUTANEOUS | Status: DC | PRN

## 2013-11-08 MED ORDER — BISACODYL 10 MG RE SUPP: 10.0000 mg | Freq: Every day | RECTAL | Status: DC | PRN

## 2013-11-08 MED ORDER — OXYCODONE-ACETAMINOPHEN 5-325 MG PO TABS: 1.0000 | ORAL_TABLET | ORAL | Status: DC | PRN

## 2013-11-08 MED ORDER — SIMETHICONE 80 MG PO CHEW: 80.0000 mg | CHEWABLE_TABLET | ORAL | Status: DC | PRN

## 2013-11-08 MED ORDER — ONDANSETRON HCL 4 MG PO TABS: 4.0000 mg | ORAL_TABLET | ORAL | Status: DC | PRN

## 2013-11-08 MED ORDER — FLEET ENEMA 7-19 GM/118ML RE ENEM: 1.0000 | ENEMA | Freq: Every day | RECTAL | Status: DC | PRN

## 2013-11-08 MED ORDER — DIBUCAINE 1 % RE OINT: 1.0000 "application " | TOPICAL_OINTMENT | RECTAL | Status: DC | PRN

## 2013-11-08 MED ORDER — METHYLERGONOVINE MALEATE 0.2 MG/ML IJ SOLN: 0.2000 mg | INTRAMUSCULAR | Status: DC | PRN

## 2013-11-08 MED ORDER — ONDANSETRON HCL 4 MG/2ML IJ SOLN: 4.0000 mg | INTRAMUSCULAR | Status: DC | PRN

## 2013-11-08 MED ORDER — FERROUS SULFATE 325 (65 FE) MG PO TABS
325.0000 mg | ORAL_TABLET | Freq: Two times a day (BID) | ORAL | Status: DC
  Administered 2013-11-08 – 2013-11-09 (×3): 325 mg via ORAL
  Filled 2013-11-08 (×3): qty 1

## 2013-11-08 MED ORDER — LANOLIN HYDROUS EX OINT: TOPICAL_OINTMENT | CUTANEOUS | Status: DC | PRN

## 2013-11-08 MED ORDER — PRENATAL MULTIVITAMIN CH
1.0000 | ORAL_TABLET | Freq: Every day | ORAL | Status: DC
  Administered 2013-11-08 – 2013-11-09 (×2): 1 via ORAL
  Filled 2013-11-08 (×2): qty 1

## 2013-11-08 MED ORDER — DIPHENHYDRAMINE HCL 25 MG PO CAPS: 25.0000 mg | ORAL_CAPSULE | Freq: Four times a day (QID) | ORAL | Status: DC | PRN

## 2013-11-08 MED ORDER — IBUPROFEN 600 MG PO TABS
600.0000 mg | ORAL_TABLET | Freq: Four times a day (QID) | ORAL | Status: DC
  Administered 2013-11-08 – 2013-11-09 (×6): 600 mg via ORAL
  Filled 2013-11-08 (×7): qty 1

## 2013-11-08 MED ORDER — METHYLERGONOVINE MALEATE 0.2 MG PO TABS: 0.2000 mg | ORAL_TABLET | ORAL | Status: DC | PRN

## 2013-11-08 MED ORDER — NICOTINE 14 MG/24HR TD PT24
14.0000 mg | MEDICATED_PATCH | Freq: Every day | TRANSDERMAL | Status: DC
  Administered 2013-11-08: 14 mg via TRANSDERMAL
  Filled 2013-11-08 (×3): qty 1

## 2013-11-08 MED ORDER — MEASLES, MUMPS & RUBELLA VAC ~~LOC~~ INJ
0.5000 mL | INJECTION | Freq: Once | SUBCUTANEOUS | Status: DC
  Filled 2013-11-08: qty 0.5

## 2013-11-08 MED ORDER — BENZOCAINE-MENTHOL 20-0.5 % EX AERO: 1.0000 "application " | INHALATION_SPRAY | CUTANEOUS | Status: DC | PRN

## 2013-11-08 MED ORDER — OXYCODONE-ACETAMINOPHEN 5-325 MG PO TABS: 2.0000 | ORAL_TABLET | ORAL | Status: DC | PRN

## 2013-11-08 MED ORDER — OXYTOCIN 40 UNITS IN LACTATED RINGERS INFUSION - SIMPLE MED: 62.5000 mL/h | INTRAVENOUS | Status: DC | PRN

## 2013-11-08 NOTE — Progress Notes (Signed)
UR completed 

## 2013-11-08 NOTE — Progress Notes (Signed)
Post Partum Day 1 Subjective: no complaints, up ad lib, voiding, tolerating PO and + flatus  Objective: Blood pressure 113/74, pulse 83, temperature 98 F (36.7 C), temperature source Oral, resp. rate 20, height 6' (1.829 m), weight 280 lb (127.007 kg), last menstrual period 01/27/2013, SpO2 99.00%, unknown if currently breastfeeding.  Physical Exam:  General: alert, cooperative and no distress Lochia: appropriate Uterine Fundus: firm DVT Evaluation: No evidence of DVT seen on physical exam. Negative Homan's sign. No cords or calf tenderness. No significant calf/ankle edema.   Recent Labs  11/06/13 0805  HGB 13.2  HCT 38.6    Assessment/Plan: Plan for discharge tomorrow, Social Work consult for UDS positive for THC during pregnancy. Contraception undecided. Bottle feeding.   LOS: 2 days   Tawni CarnesWight, Andrew 11/08/2013, 8:04 AM   I have seen and examined this patient and agree the above assessment. CRESENZO-DISHMAN,Lether Tesch 11/11/2013 7:55 PM

## 2013-11-09 LAB — GLUCOSE, CAPILLARY: GLUCOSE-CAPILLARY: 85 mg/dL (ref 70–99)

## 2013-11-09 MED ORDER — IBUPROFEN 600 MG PO TABS: 600.0000 mg | ORAL_TABLET | Freq: Four times a day (QID) | ORAL | Status: DC

## 2013-11-09 MED ORDER — DOCUSATE SODIUM 100 MG PO CAPS: 100.0000 mg | ORAL_CAPSULE | Freq: Two times a day (BID) | ORAL | Status: DC

## 2013-11-09 NOTE — Progress Notes (Signed)
Clinical Social Work Department PSYCHOSOCIAL ASSESSMENT - MATERNAL/CHILD 15-Jan-2014  Patient:  Colleen Wiggins  Account Number:  [id number]  Admit Date:  07-14-2013  Ardine Eng Name:   [place] Social Worker:  Lucita Ferrara, CLINICAL SOCIAL WORKER   Date/Time:  December 19, 2013 09:15 AM  Date Referred:  01/08/2014   Referral source  [facility]     Referred reason  Substance Abuse   Other referral source:    I:  FAMILY / [place] legal guardian:  PARENT  Guardian - Name Guardian - Age Guardian - Address  Colleen Wiggins [address], [place]   Other household support members/support persons Name Relationship DOB   MOTHER    AUNT    GRAND MOTHER    Other support:   MOB stated that she and the FOB ended their relationship 2-3 months ago.  She did not identify him as a support person.  MOB stated that she lives with her aunt and her grandmother, and discussed numerous family membres who are supportive.    II  PSYCHOSOCIAL DATA Information Source:  Patient Interview  Occupational hygienist Employment:   MOB is currently unemployed.  She stated that she intends to return to work once the baby is older.   Financial resources:  Medicaid If Medicaid - County:  [place] / Grade:  N/A Music therapist / Child Services Coordination / Early Interventions:   Per MOB, she has spoken to someone about arranging for in-home visit from a nurse.  Cultural issues impacting care:   None reported.    III  STRENGTHS Strengths  Adequate Resources  Home prepared for Child (including basic supplies)  Supportive family/friends   Strength comment:    IV  RISK FACTORS AND CURRENT PROBLEMS Current Problem:  YES   Risk Factor & Current Problem Patient Issue Family Issue Risk Factor / Current Problem Comment  Substance Abuse Y N MOB presented with a UDS positive for THC and benzos in March. The  baby's UDS is negative.    V  SOCIAL WORK ASSESSMENT CSW met with the MOB in her room in order to complete the assessment. Consult was ordered due to [facility] presenting with a UDS positive for THC and benzos in March.  MOB was receptive to the visit, but presented as anxious as she thought "something was wrong" which resulted in the [place] visit.  She reported feeling less nervous about the visit once the CSW discussed reason for consult.  MOB displayed full range in affect, presented in a pleasant mood, and was observed to be attentive and bonding with the baby.   MOB expressed excitement as she transitions into the postpartum period.  She stated that it continues to feel surreal that she is a mother, but she is looking forward to the role transition since she never thought she would have children.  She shared that she was initially overwhelmed when she learned that she was pregnant, but has become excited with all of positive support that she receives from her support system.  As CSW explored potential stressors that may negatively impact the transition into the postpartum period, MOB disclosed that she and the FOB ended their relationship 2-3 months.  She shared belief that it was due to him "not being ready" to be a father.  MOB presented with a positive thought process and her desire to not dwell on it, but she did acknowledge that she has  processed her feelings about the relationship with her support system.  She voiced ongoing frustrations since he wants a DNA test achieved on the baby.  CSW validated the stress that this adds,and encouraged her to express her feelings to her support system when she begins to feel frustrated and overwhelmed about the loss of their relationship.  MOB agreeable.   MOB denied mental health history and denied mood episodes during her pregnancy. MOB was receptive to the education on postpartum depression, and verbalized understanding that it is "real" since she watched a close  friend experience significant postpartum depression.  MOB was agreeable to reaching out to her medical providers and support system if she experienced symptoms.  She shared awareness that she may have increased risk for developing symptoms due to recent stress and potential stress related to the FOB.   MOB acknowledged positive UDS for THC in March.  She denied any other substance use and stated that she not not used THC since March.  MOB verbalized understanding when CSW discussed drug screen policy.  MOB denied awareness of why she tested positive for benzos, and shared that "My Chart" did not display that result when she checked earlier.    No barriers to discharge.     VI SOCIAL WORK PLAN Social Work Therapist, art  No Further Intervention Required / No Barriers to Discharge   Type of pt/family education:   Postpartum depression and hospital drug screen policy   If child protective services report - county:   If child protective services report - date:   Information/referral to community resources comment:   No referrals needed at this time.   Other social work plan:   CSW to provide ongoing emotional support PRN.  CSW to monitor meconium drug screen and will CPS report if needed.

## 2013-11-09 NOTE — Discharge Instructions (Signed)

## 2013-11-09 NOTE — Discharge Summary (Signed)
Obstetric Discharge Summary Reason for Admission: induction of labor for gestational diabetes on glyburide Prenatal Procedures: none Intrapartum Procedures: spontaneous vaginal delivery Postpartum Procedures: none Complications-Operative and Postpartum: none Hemoglobin  Date Value Ref Range Status  11/06/2013 13.2  12.0 - 15.0 g/dL Final     HCT  Date Value Ref Range Status  11/06/2013 38.6  36.0 - 46.0 % Final    Recent Labs  11/07/13 1857 11/08/13 0659 11/08/13 1302 11/08/13 2033 11/09/13 0734  GLUCAP 91 91 104* 122* 85    Physical Exam:  General: alert, cooperative and no distress Lochia: appropriate Uterine Fundus: firm DVT Evaluation: No evidence of DVT seen on physical exam. Negative Homan's sign. No cords or calf tenderness. No significant calf/ankle edema.  Discharge Diagnoses: Term Pregnancy-delivered  Discharge Information: Date: 11/09/2013 Activity: pelvic rest Diet: routine Medications: PNV, Ibuprofen and Colace Condition: stable Instructions: refer to practice specific booklet Discharge to: home   Newborn Data: Live born female  Birth Weight: 6 lb 12.5 oz (3075 g) APGAR: 8, 9  Home with mother.  Tawni CarnesWight, Andrew 11/09/2013, 7:51 AM  I have seen and examined this patient and I agree with the above. Cam HaiSHAW, KIMBERLY CNM 8:00 AM 11/09/2013

## 2013-11-09 NOTE — Anesthesia Postprocedure Evaluation (Signed)
  Anesthesia Post-op Note  Patient: Colleen Wiggins  Procedure(s) Performed: Lumbar Epidural for L&D  Complications: No apparent anesthesia complications

## 2013-11-15 ENCOUNTER — Other Ambulatory Visit: Payer: Self-pay

## 2013-11-15 ENCOUNTER — Other Ambulatory Visit: Payer: Medicaid Other

## 2013-11-15 DIAGNOSIS — O24313 Unspecified pre-existing diabetes mellitus in pregnancy, third trimester: Secondary | ICD-10-CM

## 2013-11-16 LAB — GLUCOSE TOLERANCE, 2 HOURS
GLUCOSE, 2 HOUR: 67 mg/dL — AB (ref 70–139)
Glucose, Fasting: 76 mg/dL (ref 70–99)

## 2013-11-18 ENCOUNTER — Telehealth: Payer: Self-pay

## 2013-11-18 NOTE — Telephone Encounter (Signed)
Message copied by Louanna RawAMPBELL, Edgerrin Correia M on Mon Nov 18, 2013  5:00 PM ------      Message from: Adam PhenixARNOLD, JAMES G      Created: Mon Nov 18, 2013  4:26 PM       Normal 2 hr ------

## 2013-11-18 NOTE — Telephone Encounter (Signed)
Attempted to contact patient. No answer. Voicemail stated "Hello you have reached Colleen Wiggins and evelyn..."  Left message stating calling to inform patient of results, please call clinic.

## 2013-11-19 NOTE — Telephone Encounter (Signed)
Mychart message sent to patient with results.

## 2013-12-02 ENCOUNTER — Encounter (HOSPITAL_COMMUNITY): Payer: Self-pay

## 2013-12-04 ENCOUNTER — Other Ambulatory Visit (HOSPITAL_COMMUNITY)
Admission: RE | Admit: 2013-12-04 | Discharge: 2013-12-04 | Disposition: A | Discharge status: Home / Self Care | Payer: Medicaid Other | Source: Ambulatory Visit | Attending: Obstetrics & Gynecology | Admitting: Obstetrics & Gynecology

## 2013-12-04 ENCOUNTER — Ambulatory Visit (INDEPENDENT_AMBULATORY_CARE_PROVIDER_SITE_OTHER): Payer: Medicaid Other | Admitting: Obstetrics & Gynecology

## 2013-12-04 ENCOUNTER — Encounter: Payer: Self-pay | Admitting: Obstetrics & Gynecology

## 2013-12-04 DIAGNOSIS — Z1151 Encounter for screening for human papillomavirus (HPV): Secondary | ICD-10-CM | POA: Insufficient documentation

## 2013-12-04 DIAGNOSIS — Z01411 Encounter for gynecological examination (general) (routine) with abnormal findings: Secondary | ICD-10-CM | POA: Diagnosis present

## 2013-12-04 DIAGNOSIS — Z124 Encounter for screening for malignant neoplasm of cervix: Secondary | ICD-10-CM

## 2013-12-04 DIAGNOSIS — I10 Essential (primary) hypertension: Secondary | ICD-10-CM | POA: Insufficient documentation

## 2013-12-04 MED ORDER — HYDROCHLOROTHIAZIDE 25 MG PO TABS: 25.0000 mg | ORAL_TABLET | Freq: Every day | ORAL | Status: DC

## 2013-12-04 NOTE — Progress Notes (Signed)
    Subjective:     Colleen Wiggins is a 38 y.o. [id number] female who presents for a postpartum visit. She is 4 weeks postpartum following a spontaneous vaginal delivery. I have fully reviewed the prenatal and intrapartum course; [facility] and A2/B GDM. The delivery was at 39 gestational weeks. Anesthesia: none. Postpartum course has been uncomplicated; she had a normal 2 hr GTT. Baby's course has been uncomplicated. Baby is feeding by bottle. Bleeding no bleeding. Bowel function is normal. Bladder function is normal. Patient is sexually active. Contraception method is abstinence. Postpartum depression screening: negative.  The following portions of the patient's history were reviewed and updated as appropriate: allergies, current medications, past family history, past medical history, past social history, past surgical history and problem list. Cannot remember when she had last pap smear.  Review of Systems Pertinent items are noted in HPI.   Objective:    BP 164/101 mmHg  Pulse 79  Temp(Src) 98.3 F (36.8 C) (Oral)  Ht 5\' 11"  (1.803 m)  Wt 264 lb (119.75 kg)  BMI 36.84 kg/m2  Breastfeeding? No  General:  alert and no distress   Breasts:  inspection negative, no nipple discharge or bleeding, no masses or nodularity palpable  Lungs: clear to auscultation bilaterally  Heart:  regular rate and rhythm  Abdomen: soft, non-tender; bowel sounds normal; no masses,  no organomegaly   Vulva:  normal  Vagina: normal vagina, brown discharge noted  Cervix:  multiparous appearance, no bleeding following Pap and no cervical motion tenderness  Corpus: normal size, contour, position, consistency, mobility, non-tender  Adnexa:  normal adnexa and no mass, fullness, tenderness  Rectal Exam: Not performed.    Results for orders placed or performed in visit on 11/15/13 (from the past 504 hour(s))  Glucose tolerance, 2 hours   Collection Time: 11/15/13 11:16 AM  Result Value Ref  Range   Glucose, Fasting 76 70 - 99 mg/dL   Glucose, 2 hour 67 (L) 70 - 139 mg/dL     Assessment:   Patient is here for postpartum exam. Pap smear done at today's visit.   Denies headache, visual symptoms or abdominal pain.   Plan:   1. Contraception: abstinence 2. Will follow up pap results and manage accordingly. 3. HCTZ 25 mg po qd prescribed, needs to follow up with PCP, [facility] number given to patient.  Will follow up with Dr. Adrian BlackwaterStinson in 2 weeks for BP evaluation/management/medication review. 4. Follow up in: 2 weeks or as needed.     Colleen CollinsUGONNA  Haidee Stogsdill, MD, FACOG Attending Obstetrician & Gynecologist Center for [facility], [facility]

## 2013-12-04 NOTE — Patient Instructions (Signed)
Return to clinic for any scheduled appointments or for any gynecologic concerns as needed.   

## 2013-12-06 LAB — CYTOLOGY - PAP

## 2013-12-19 ENCOUNTER — Ambulatory Visit (INDEPENDENT_AMBULATORY_CARE_PROVIDER_SITE_OTHER): Payer: Medicaid Other | Admitting: Family Medicine

## 2013-12-19 VITALS — BP 120/71 | HR 88 | Ht 71.0 in | Wt 269.6 lb

## 2013-12-19 DIAGNOSIS — I10 Essential (primary) hypertension: Secondary | ICD-10-CM

## 2013-12-19 NOTE — Progress Notes (Signed)
   Subjective:    Patient ID: Colleen Wiggins, female    DOB: 02/06/1975, 38 y.o.   MRN: [id number]  HPI Patient seen for follow up of HTN.  She is now 8 weeks postpartum.  She was placed on HCTZ at her last appt, which she is taking and tolerating well.  She denies HA, nausea, vomiting, edema, SOB, vision changes, claudication.   Review of Systems     Objective:   Physical Exam  Constitutional: She appears well-developed and well-nourished.  Cardiovascular: Normal rate, regular rhythm and normal heart sounds.   Pulmonary/Chest: Effort normal and breath sounds normal.  Skin: Skin is warm and dry.  Psychiatric: She has a normal mood and affect. Her behavior is normal. Judgment and thought content normal.      Assessment & Plan:   Problem List Items Addressed This Visit    Essential hypertension - Primary     Continue HCTZ, low salt intake, exercise encouraged.  Patient encouraged to find Primary Care to manage HTN.  If any concerns or questions, call.

## 2013-12-19 NOTE — Patient Instructions (Signed)

## 2014-07-28 ENCOUNTER — Other Ambulatory Visit: Payer: Self-pay

## 2016-03-17 IMAGING — US US OB FOLLOW-UP
1 series · 12 of 28 positions shown · non-contrast
Comparison: none

[Series 1: us ob follow-up · 0.23mm/px · 12 of 35 slices shown]
[im 2/35]
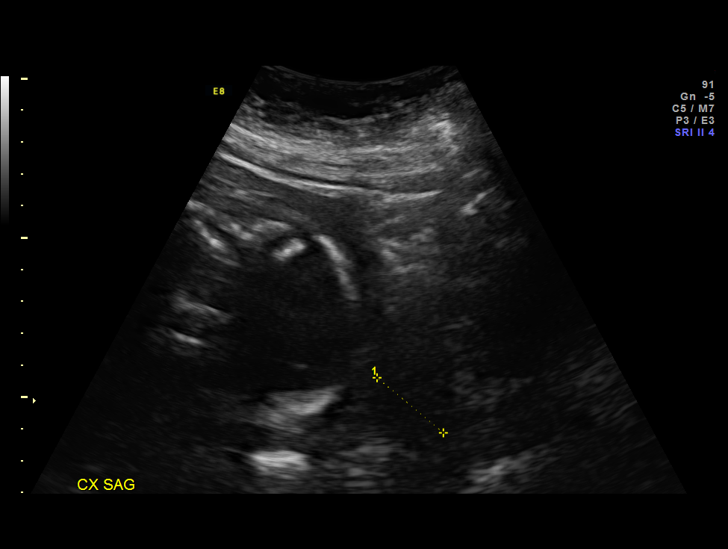
[im 4/35]
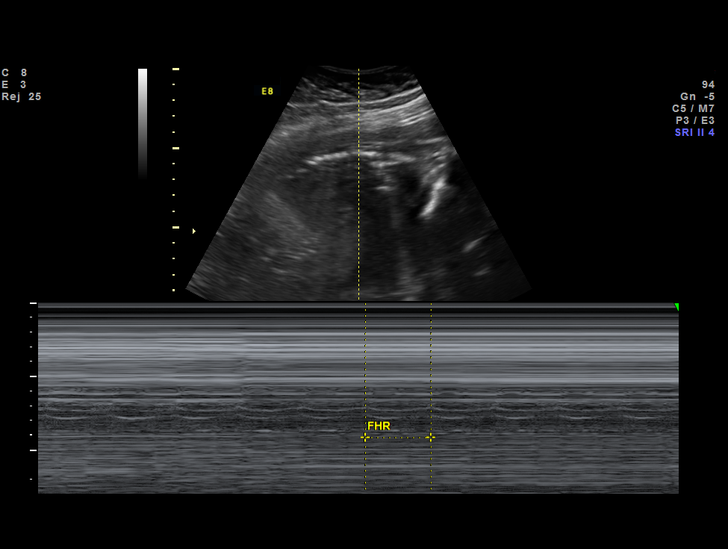
[im 7/35]
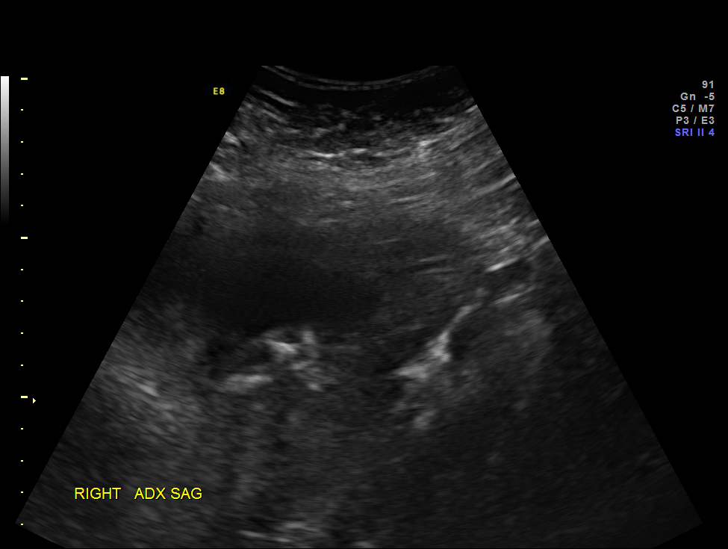
[im 11/35]
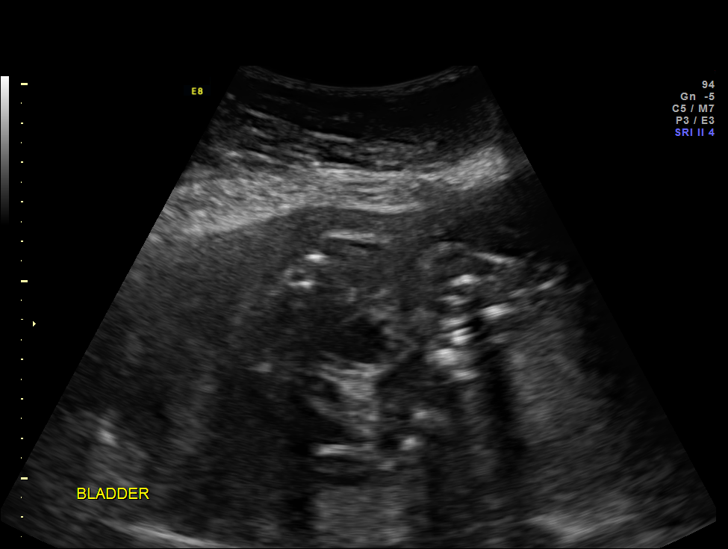
[im 13/35]
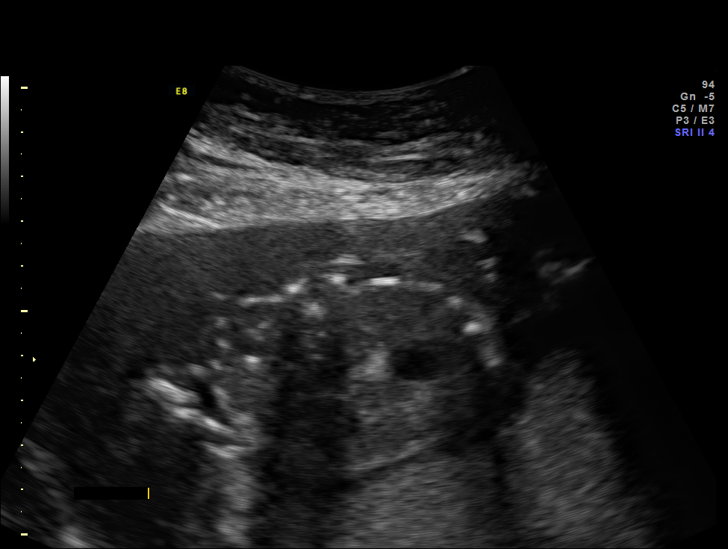
[im 16/35]
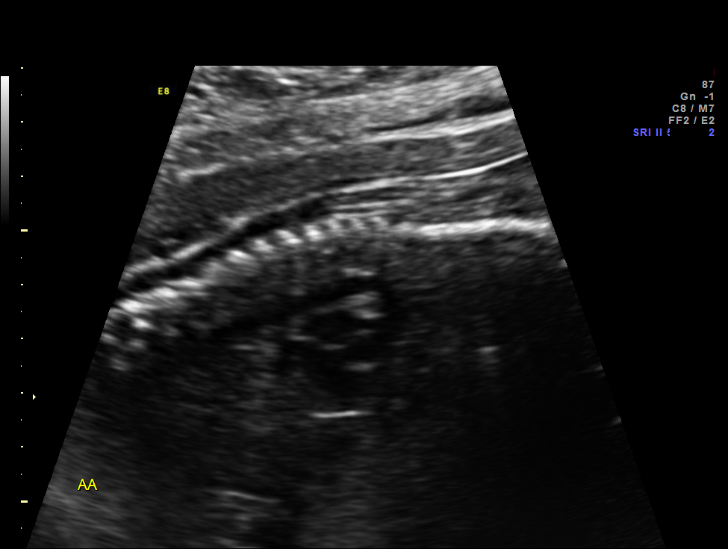
[im 19/35]
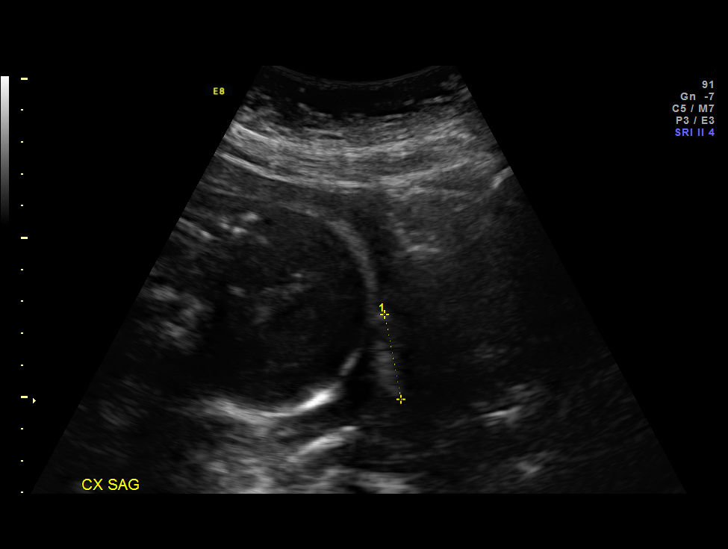
[im 22/35]
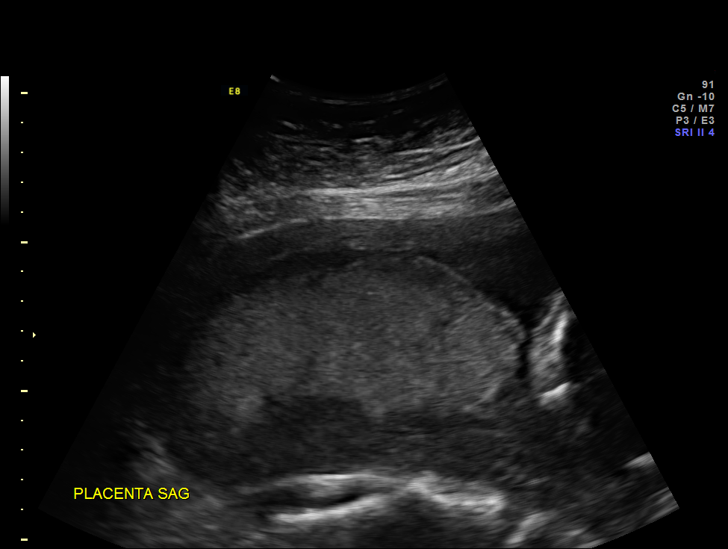
[im 24/35]
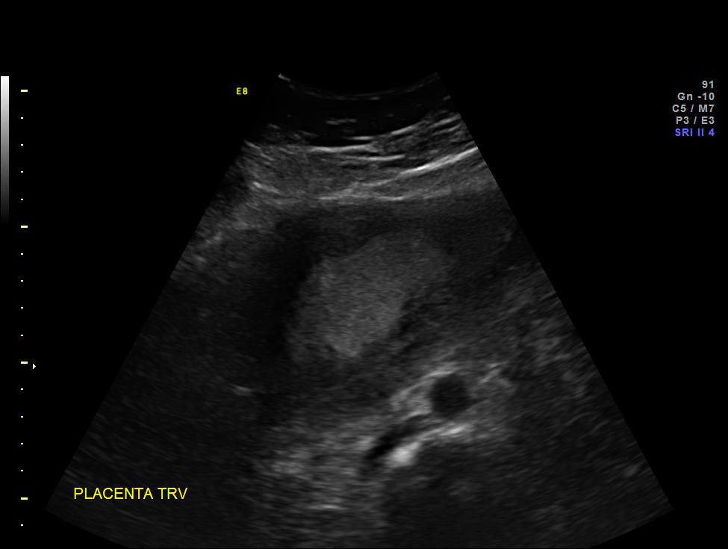
[im 28/35]
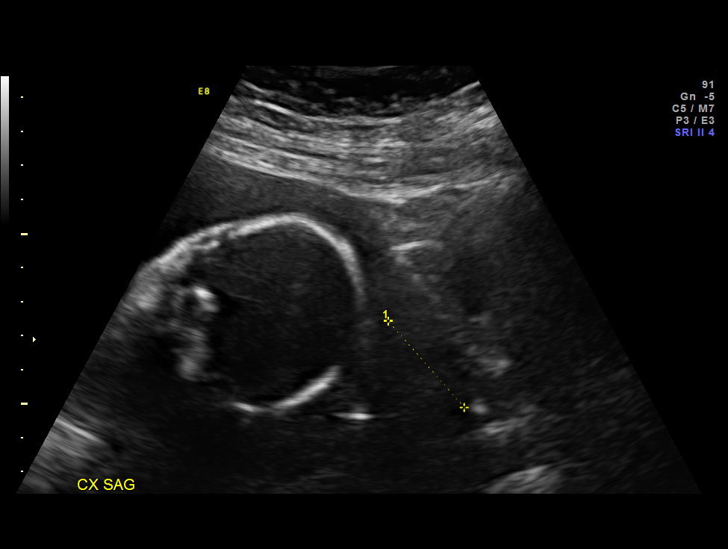
[im 31/35]
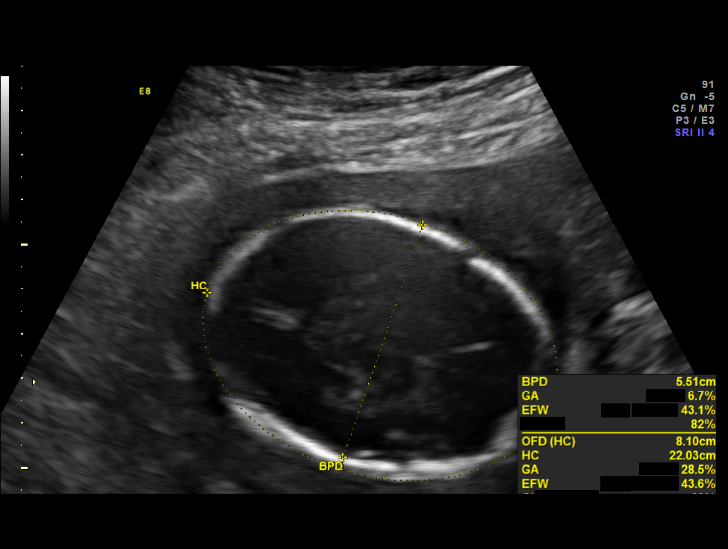
[im 33/35]
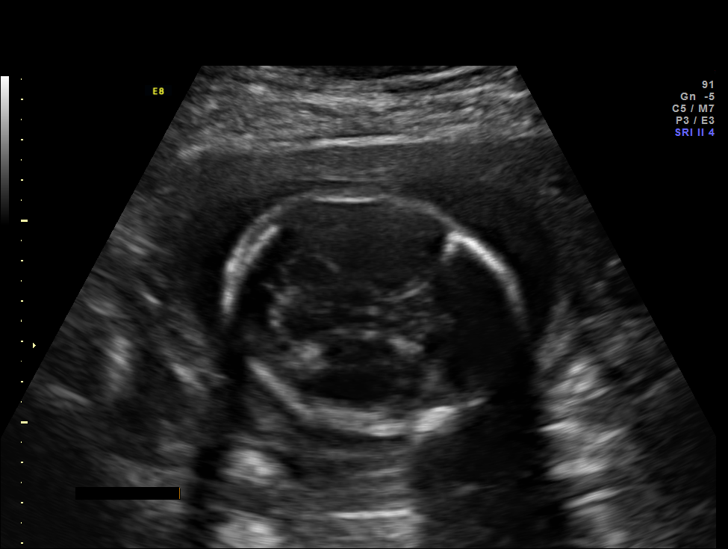

[12 of 28 positions shown; findings below may reference images not displayed]

OBSTETRICS REPORT
                      (Signed Final 07/25/2013 [DATE])

Service(s) Provided

 US OB FOLLOW UP                                       76816.1
Indications

 Advanced maternal age (37), Primigravida
 Diabetes - Pregestational on glyburide
 Hypertension - Chronic/Pre-existing
 Cigarette smoker
 High risk pregnancy due to maternal drug abuse        648.40, 305.90,

 Abnormal biochemical screen (quad) for Trisomy
 21
 Obesity complicating pregnancy                        649.13,
 Elevated inhibin
Fetal Evaluation

 Num Of Fetuses:    1
 Fetal Heart Rate:  153                          bpm
 Cardiac Activity:  Observed
 Presentation:      Cephalic
 Placenta:          Posterior, above cervical
                    os
 P. Cord            Not well visualized
 Insertion:

 Amniotic Fluid
 AFI FV:      Subjectively within normal limits
                                             Larg Pckt:     4.4  cm
Biometry

 BPD:     55.1  mm     G. Age:  22w 6d                CI:         68.5   70 - 86
 OFD:     80.4  mm                                    FL/HC:      20.4   18.7 -

 HC:       219  mm     G. Age:  23w 6d       25  %    HC/AC:      1.20   1.05 -

 AC:     182.1  mm     G. Age:  23w 0d       13  %    FL/BPD:     81.1   71 - 87
 FL:      44.7  mm     G. Age:  24w 5d       57  %    FL/AC:      24.5   20 - 24

 Est. FW:     626  gm      1 lb 6 oz     44  %
Gestational Age
 U/S Today:     23w 4d                                        EDD:   11/17/13
 Best:          24w 1d     Det. By:  Early Ultrasound         EDD:   11/13/13
                                     (04/11/13)
Anatomy

 Cranium:          Appears normal         Aortic Arch:      Appears normal
 Fetal Cavum:      Previously seen        Ductal Arch:      Previously seen
 Ventricles:       Appears normal         Diaphragm:        Appears normal
 Choroid Plexus:   Previously seen        Stomach:          Appears normal, left
                                                            sided
 Cerebellum:       Appears normal         Abdomen:          Appears normal
 Posterior Fossa:  Previously seen        Abdominal Wall:   Previously seen
 Nuchal Fold:      Appears normal         Cord Vessels:     Previously seen
 Face:             Orbits nl, profile not Kidneys:          Appear normal
                   well visualized
 Lips:             Previously seen        Bladder:          Appears normal
 Heart:            Previously seen        Spine:            Previously seen
 RVOT:             Previously seen        Lower             Previously seen
                                          Extremities:
 LVOT:             Previously seen        Upper             Previously seen
                                          Extremities:

 Other:  Technically difficult due to maternal habitus and fetal position. Heels
         and Rt fifth digit previously visualized.
Cervix Uterus Adnexa

 Cervical Length:    3.4      cm

 Cervix:       Normal appearance by transabdominal scan.

 Adnexa:     No abnormality visualized.
Impression

 SIUP at 24+1 weeks
 Normal interval anatomy; anatomic survey complete
 Normal amniotic fluid volume
 Appropriate interval growth with EFW at the 44th %tile
Recommendations

 Follow-up ultrasound for growth in 4 weeks

## 2016-05-24 IMAGING — US US FETAL BPP W/O NONSTRESS
1 series · 13 of 28 positions shown · non-contrast
Comparison: none

[Series 1: us fetal bpp w/o nonstress · non-contrast · 39 acquisitions, 13 frames shown]
[im 2/39]
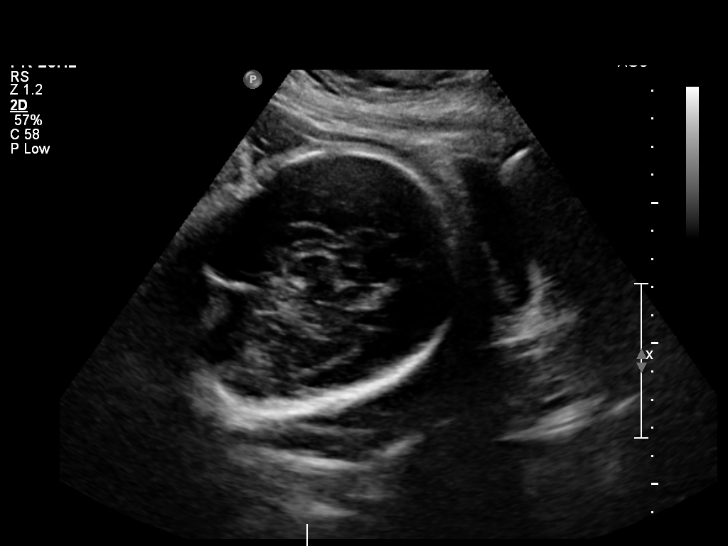
[im 5/39]
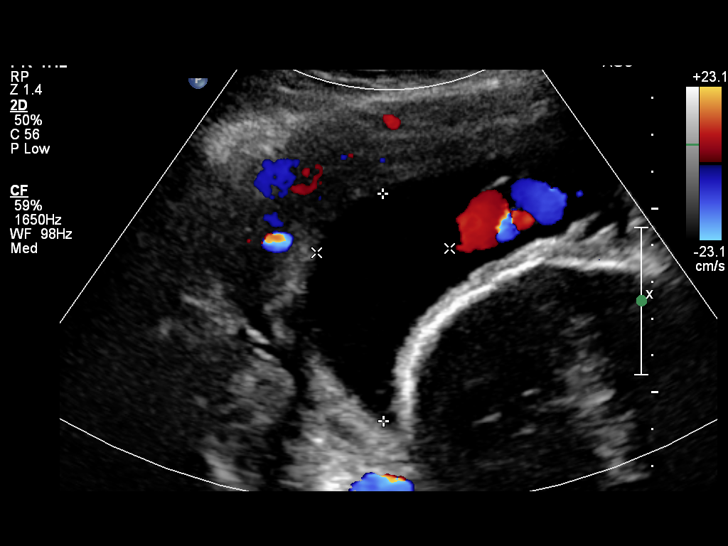
[im 8/39]
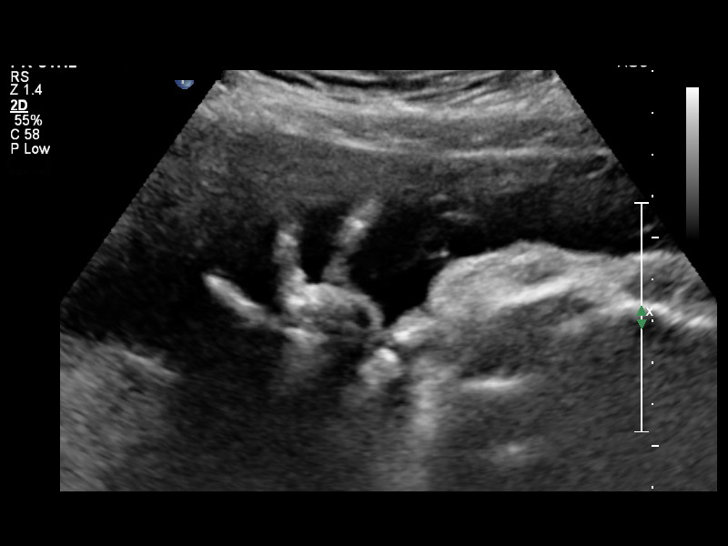
[im 10/39]
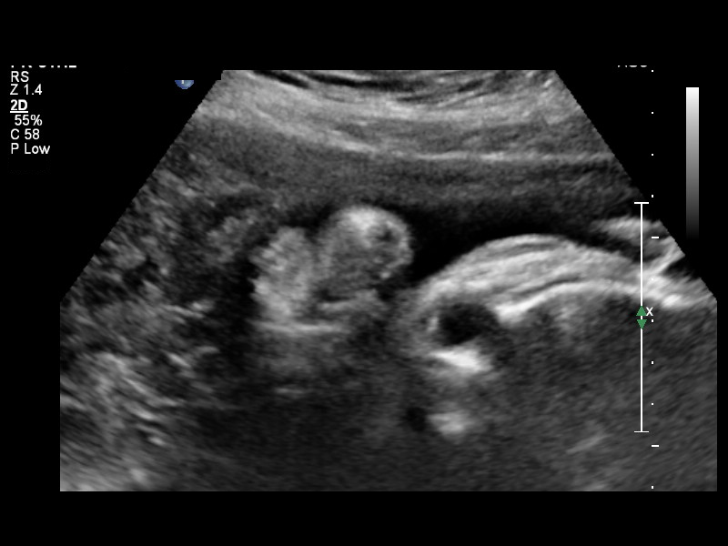
[im 13/39]
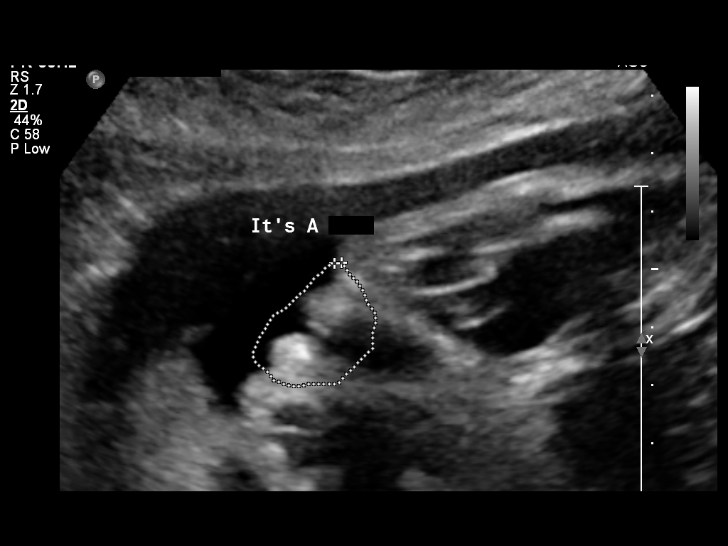
[im 16/39]
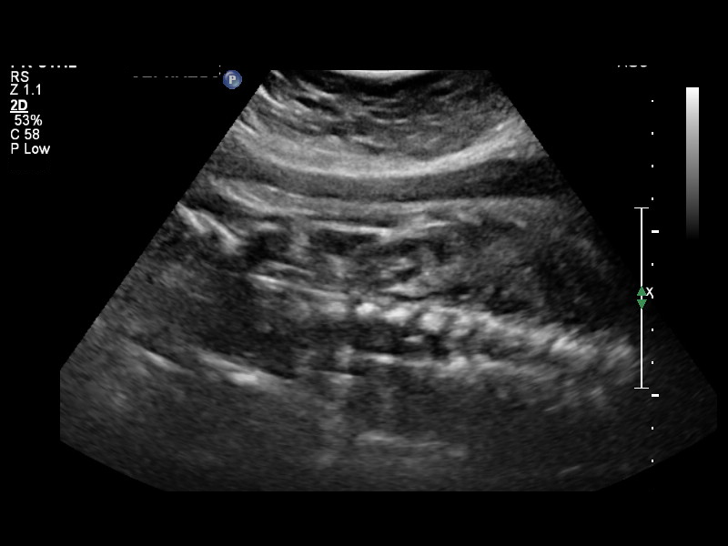
[im 20/39]
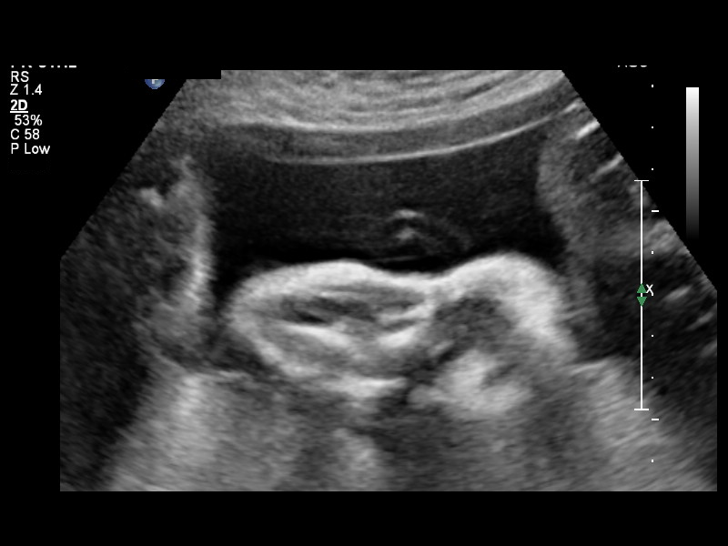
[im 23/39]
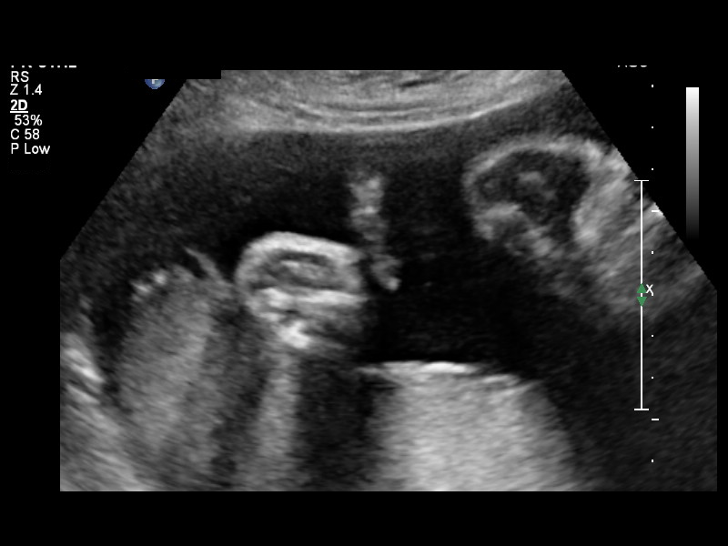
[im 26/39]
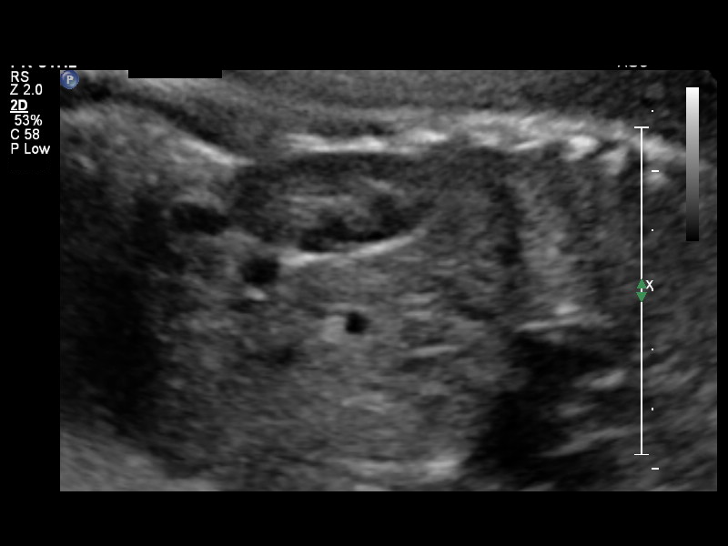
[im 29/39]
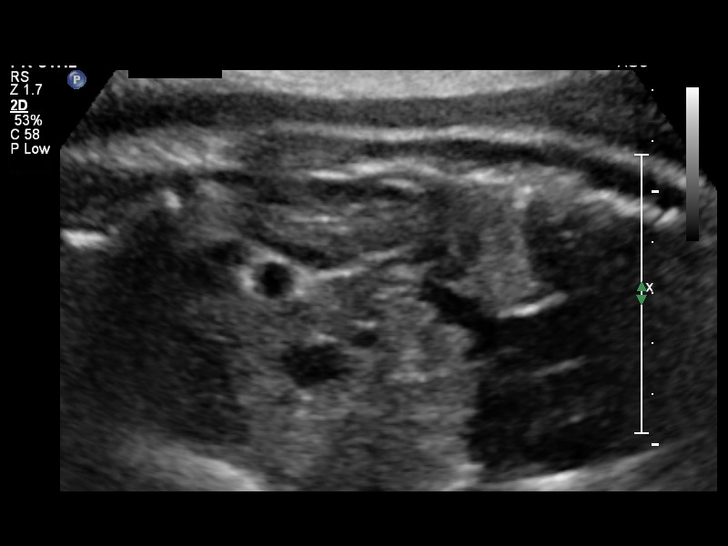
[im 31/39]
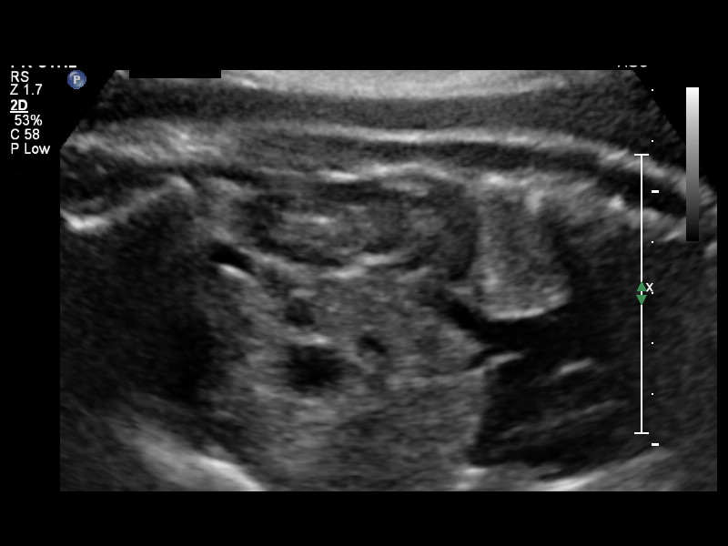
[im 34/39]
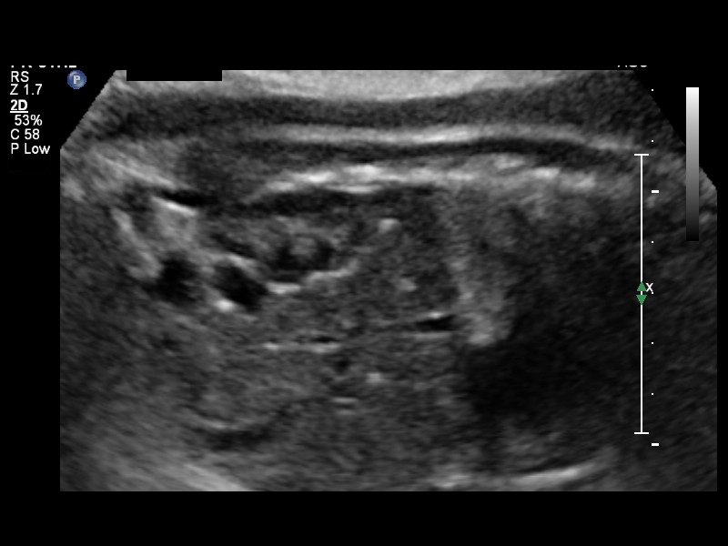
[im 37/39]
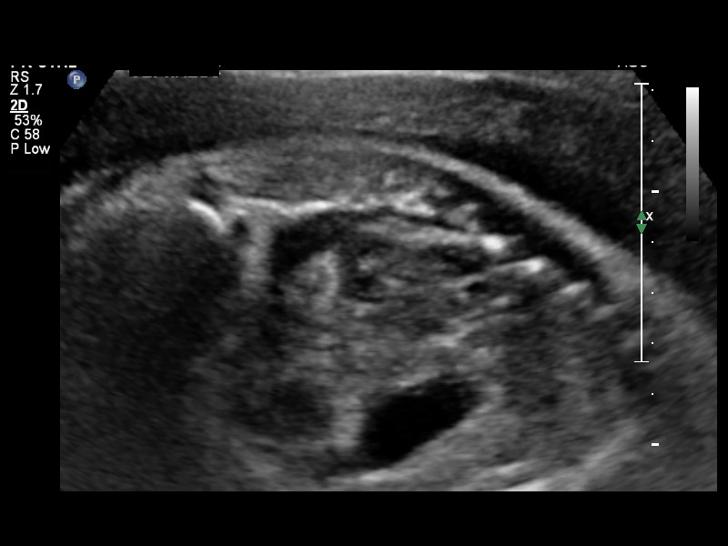

[13 of 28 positions shown; findings below may reference images not displayed]

OBSTETRICS REPORT
                      (Signed Final 10/01/2013 [DATE])

Service(s) Provided

Indications

 Abnormal biochemical screen (quad) for Trisomy
 21 (DSR [DATE]); declined testing
 Elevated Inhibin (2.79 MoM)
 Advanced maternal age (38), Primigravida
 Diabetes - Pregestational (on glyburide) - normal
 fetal ECHO
 Hypertension - Chronic/Pre-existing (no meds)
 Cigarette smoker
 High risk pregnancy due to maternal drug abuse        648.40, 305.90,

 Obesity complicating pregnancy                        649.13,
 Non-reactive NST
Fetal Evaluation

 Num Of Fetuses:    1
 Fetal Heart Rate:  142                          bpm
 Cardiac Activity:  Observed
 Presentation:      Cephalic

 Amniotic Fluid
 AFI FV:      Subjectively within normal limits
                                             Larg Pckt:    6.17  cm
Biophysical Evaluation

 Amniotic F.V:   Pocket => 2 cm two         F. Tone:        Observed
                 planes
 F. Movement:    Observed                   Score:          [DATE]
 F. Breathing:   Observed
Gestational Age

 Best:          33w 6d     Det. By:  Early Ultrasound         EDD:   11/13/13
                                     (04/11/13)
Impression

 SIUP at 33+6 weeks
 Normal amniotic fluid volume
 BPP [DATE]
Recommendations

 Continue twice weekly NSTs with weekly AFIs

## 2016-06-03 IMAGING — US US OB LIMITED
1 series · 13 of 13 positions shown · non-contrast
Comparison: none

[Series 1: us ob limited · 0.26mm/px · 13 of 13 slices shown]
[im 1/13]
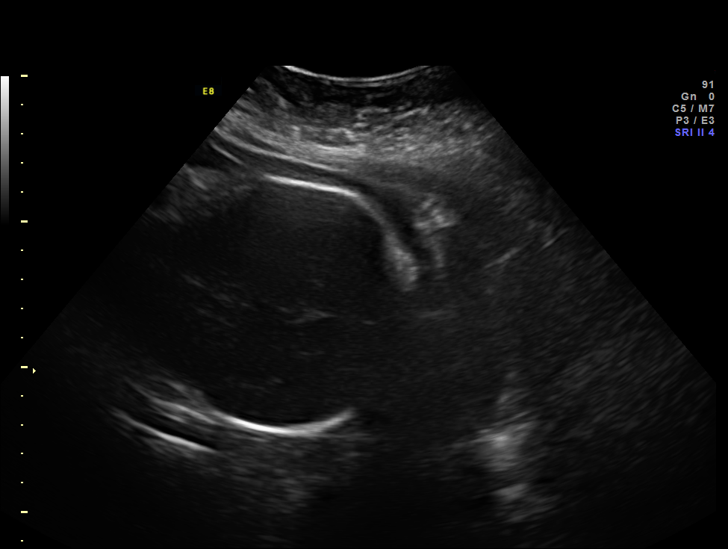
[im 2/13]
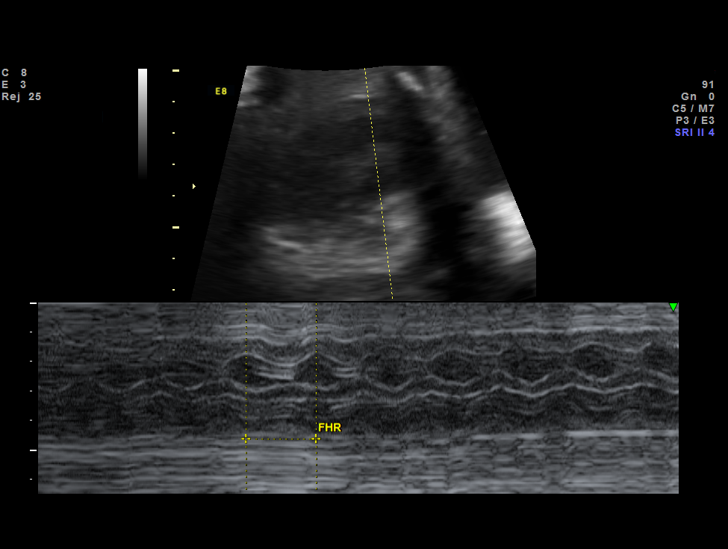
[im 3/13]
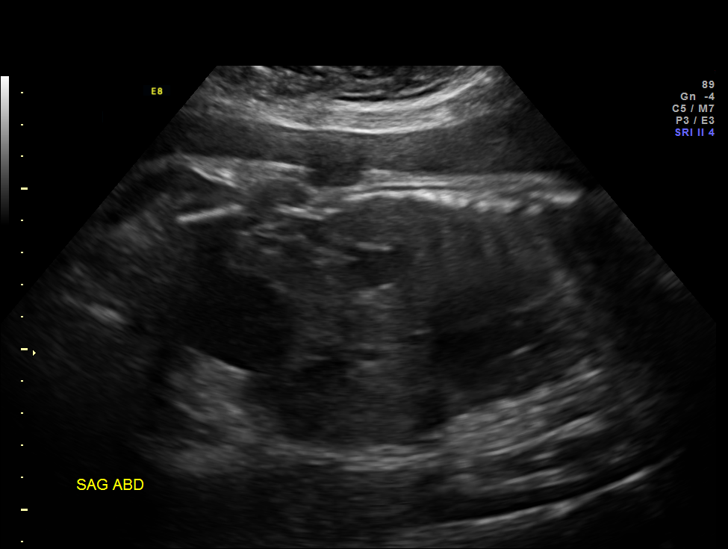
[im 4/13]
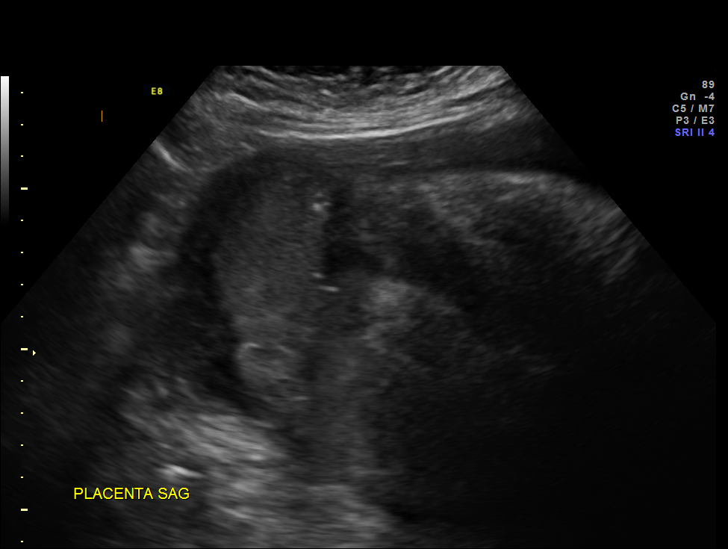
[im 5/13]
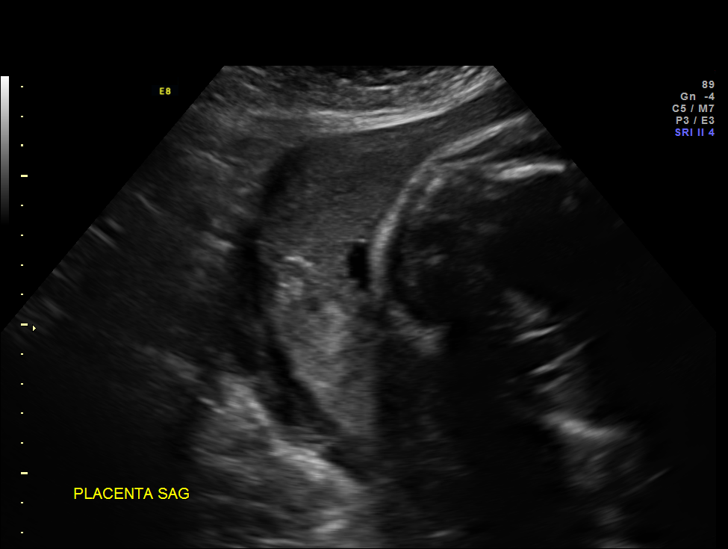
[im 6/13]
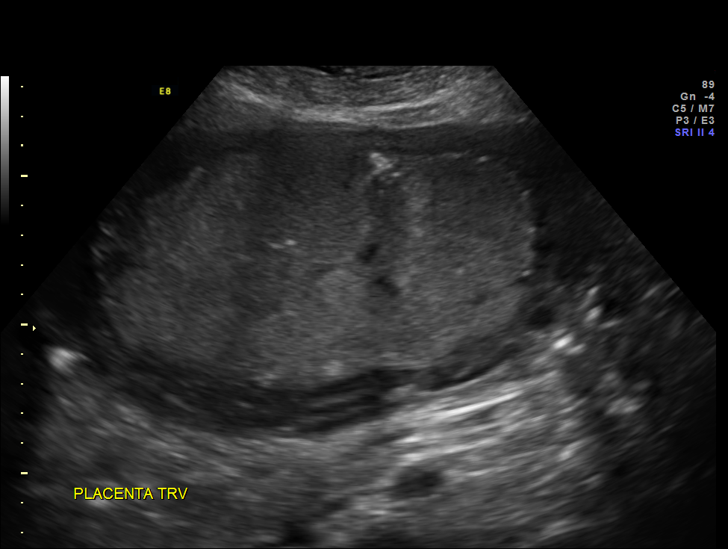
[im 7/13]
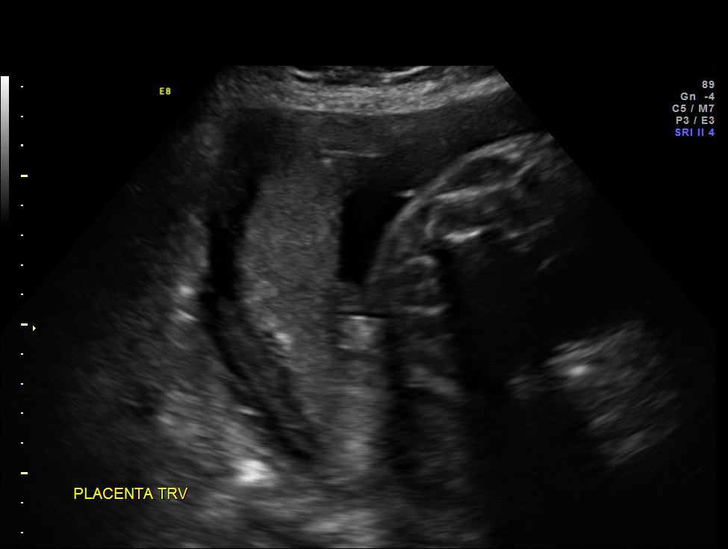
[im 8/13]
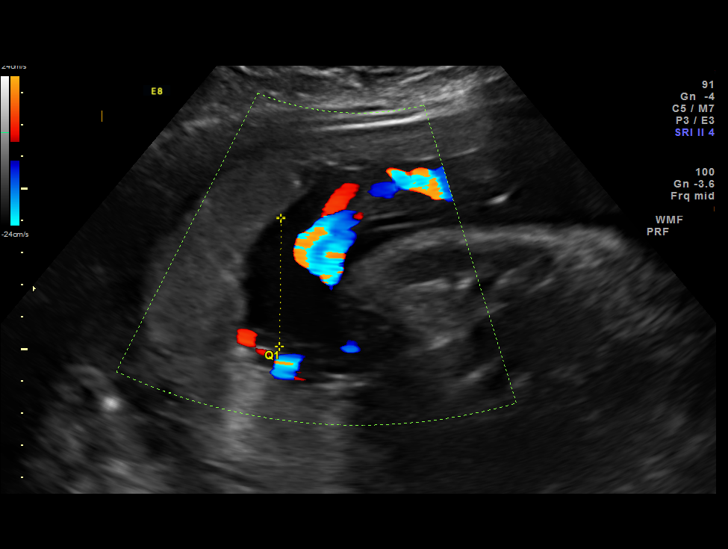
[im 9/13]
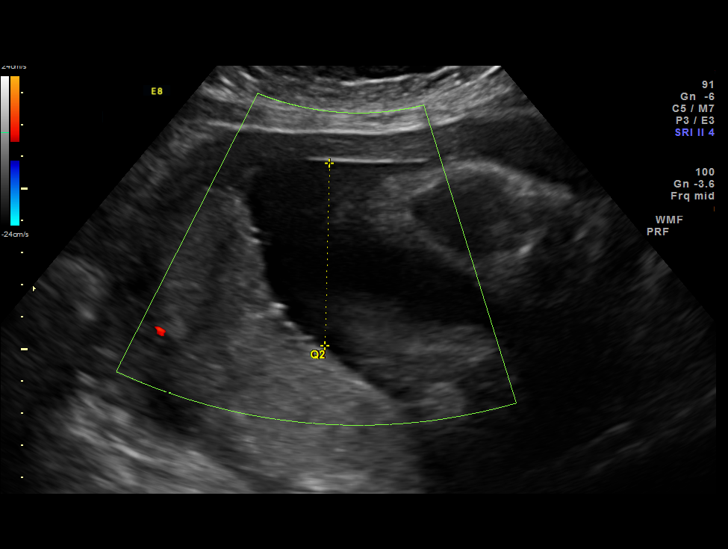
[im 10/13]
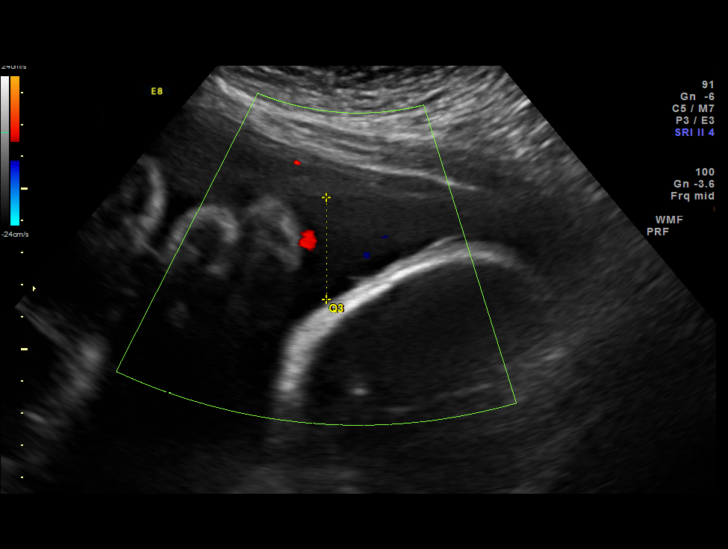
[im 11/13]
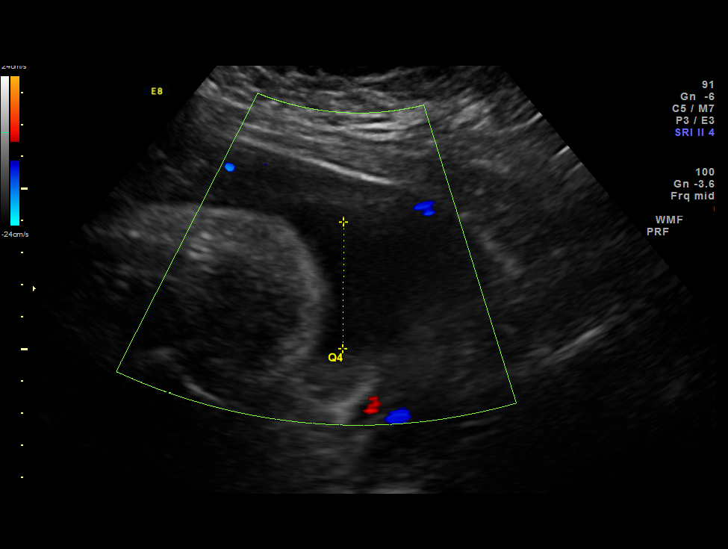
[im 12/13]
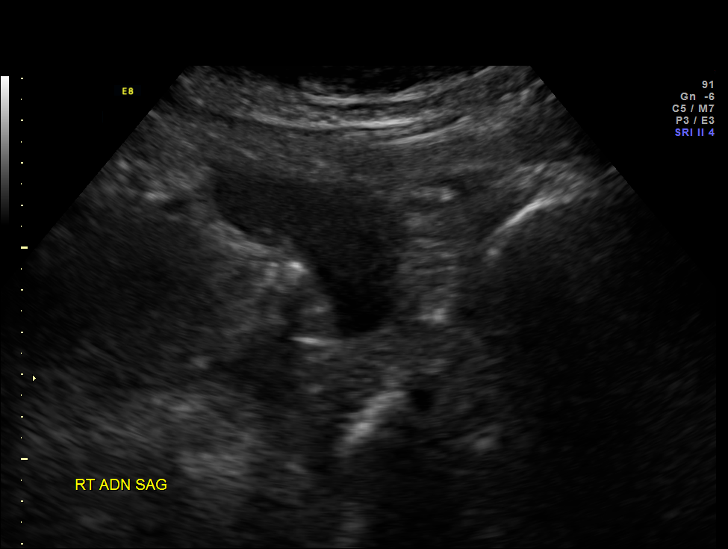
[im 13/13]
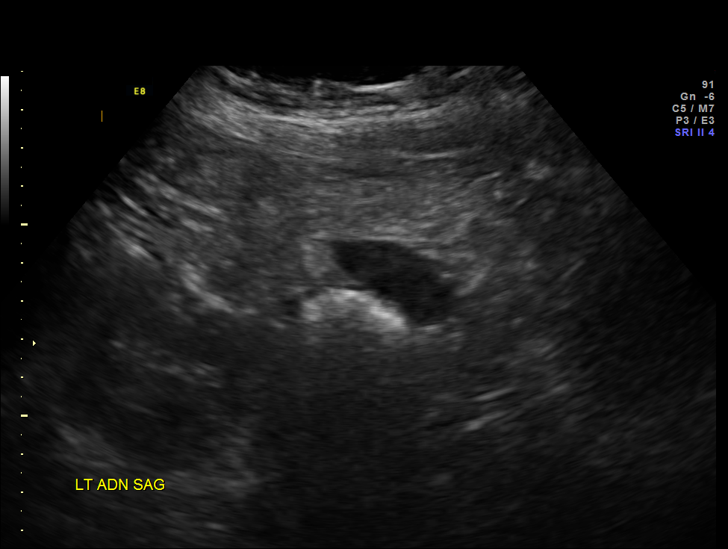

[13 of 13 positions shown; findings below may reference images not displayed]

OBSTETRICS REPORT
                      (Signed Final 10/11/2013 [DATE])

Service(s) Provided

 [HOSPITAL]                                         76815.0
Indications

 Abnormal biochemical screen (quad) for Trisomy
 21 (DSR [DATE]); declined testing
 Elevated Inhibin (2.79 MoM)
 Advanced maternal age (38), Primigravida
 Diabetes - Pregestational (on glyburide) - normal
 fetal ECHO
 Hypertension - Chronic/Pre-existing (no meds)
 Cigarette smoker
 High risk pregnancy due to maternal drug abuse        648.40, 305.90,

 Obesity complicating pregnancy                        649.13,
 Assess amniotic fluid volume
Fetal Evaluation

 Num Of Fetuses:    1
 Fetal Heart Rate:  144                          bpm
 Cardiac Activity:  Observed
 Presentation:      Cephalic
 Placenta:          Posterior, above cervical
                    os
 P. Cord            Previously Visualized
 Insertion:

 Amniotic Fluid
 AFI FV:      Subjectively within normal limits
 AFI Sum:     16.81   cm       62  %Tile     Larg Pckt:    5.69  cm
 RUQ:   3.99    cm   RLQ:    3.95   cm    LUQ:   5.69    cm   LLQ:    3.18   cm
Gestational Age

 Best:          35w 2d     Det. By:  Early Ultrasound         EDD:   11/13/13
                                     (04/11/13)
Cervix Uterus Adnexa

 Cervix:       Not visualized (advanced GA >89wks)

 Adnexa:     No abnormality visualized.
Impression

 SIUP at 35+2 weeks
 Normal amniotic fluid volume
 NST reactive
Recommendations

 Continue twice weekly NSTs with weekly AFIs
 Growth US next week

## 2016-09-01 ENCOUNTER — Emergency Department (HOSPITAL_COMMUNITY): Payer: Medicaid Other

## 2016-09-01 ENCOUNTER — Emergency Department (HOSPITAL_COMMUNITY)
Admission: EM | Admit: 2016-09-01 | Discharge: 2016-09-01 | Disposition: A | Discharge status: Home / Self Care | Payer: Medicaid Other | Attending: Emergency Medicine | Admitting: Emergency Medicine

## 2016-09-01 ENCOUNTER — Encounter (HOSPITAL_COMMUNITY): Payer: Self-pay | Admitting: Emergency Medicine

## 2016-09-01 DIAGNOSIS — O200 Threatened abortion: Secondary | ICD-10-CM | POA: Insufficient documentation

## 2016-09-01 DIAGNOSIS — F17200 Nicotine dependence, unspecified, uncomplicated: Secondary | ICD-10-CM | POA: Insufficient documentation

## 2016-09-01 DIAGNOSIS — I10 Essential (primary) hypertension: Secondary | ICD-10-CM | POA: Insufficient documentation

## 2016-09-01 DIAGNOSIS — Z3A00 Weeks of gestation of pregnancy not specified: Secondary | ICD-10-CM | POA: Insufficient documentation

## 2016-09-01 LAB — BASIC METABOLIC PANEL
ANION GAP: 8 (ref 5–15)
BUN: 8 mg/dL (ref 6–20)
CO2: 24 mmol/L (ref 22–32)
Calcium: 8.8 mg/dL — ABNORMAL LOW (ref 8.9–10.3)
Chloride: 102 mmol/L (ref 101–111)
Creatinine, Ser: 0.91 mg/dL (ref 0.44–1.00)
Glucose, Bld: 93 mg/dL (ref 65–99)
Potassium: 4 mmol/L (ref 3.5–5.1)
Sodium: 134 mmol/L — ABNORMAL LOW (ref 135–145)

## 2016-09-01 LAB — CBC WITH DIFFERENTIAL/PLATELET
Basophils Absolute: 0 10*3/uL (ref 0.0–0.1)
Basophils Relative: 0 %
EOS ABS: 0.1 10*3/uL (ref 0.0–0.7)
Eosinophils Relative: 1 %
HCT: 40.3 % (ref 36.0–46.0)
HEMOGLOBIN: 13.2 g/dL (ref 12.0–15.0)
Lymphocytes Relative: 24 %
Lymphs Abs: 2.6 10*3/uL (ref 0.7–4.0)
MCH: 29.7 pg (ref 26.0–34.0)
MCHC: 32.8 g/dL (ref 30.0–36.0)
MCV: 90.6 fL (ref 78.0–100.0)
MONOS PCT: 5 %
Monocytes Absolute: 0.6 10*3/uL (ref 0.1–1.0)
NEUTROS ABS: 7.8 10*3/uL — AB (ref 1.7–7.7)
NEUTROS PCT: 70 %
Platelets: 337 10*3/uL (ref 150–400)
RBC: 4.45 MIL/uL (ref 3.87–5.11)
RDW: 14.9 % (ref 11.5–15.5)
WBC: 11.1 10*3/uL — ABNORMAL HIGH (ref 4.0–10.5)

## 2016-09-01 LAB — I-STAT BETA HCG BLOOD, ED (MC, WL, AP ONLY)

## 2016-09-01 LAB — HCG, QUANTITATIVE, PREGNANCY: hCG, Beta Chain, Quant, S: 3913 m[IU]/mL — ABNORMAL HIGH (ref <5)

## 2016-09-01 MED ORDER — HYDROCODONE-ACETAMINOPHEN 5-325 MG PO TABS: 1.0000 | ORAL_TABLET | ORAL | 0 refills | Status: DC | PRN

## 2016-09-01 MED ORDER — OXYCODONE-ACETAMINOPHEN 5-325 MG PO TABS
2.0000 | ORAL_TABLET | Freq: Once | ORAL | Status: AC
  Administered 2016-09-01: 2 via ORAL
  Filled 2016-09-01: qty 2

## 2016-09-01 MED ORDER — OXYCODONE-ACETAMINOPHEN 5-325 MG PO TABS
1.0000 | ORAL_TABLET | Freq: Once | ORAL | Status: AC
  Administered 2016-09-01: 1 via ORAL
  Filled 2016-09-01: qty 1

## 2016-09-01 NOTE — ED Triage Notes (Signed)
Pt states she is having lower quadrant abd pain 10/10 and bleeding since noon, pt states she is pregnant and she believe she is having a miscarriage. Las menstrual period Jun 17, 2016. Having some nausea at this time, pt is very emotional on triage.

## 2016-09-01 NOTE — ED Notes (Signed)
Patient returned from US, tearful - after consoling patient, patient states she is feeling better.

## 2016-09-01 NOTE — ED Notes (Signed)
Patient transported to US 

## 2016-09-01 NOTE — ED Notes (Signed)
Patient verbalized understanding of discharge instructions and denies any further needs or questions at this time. VS stable. Patient ambulatory with steady gait. RN escorted to ED entrance in wheelchair.   

## 2016-09-01 NOTE — Discharge Instructions (Signed)
[facility] new or worsening symptoms including but not limited to lightheadedness, worsening vaginal bleeding, worsening abdominal pain.

## 2016-09-02 NOTE — ED Provider Notes (Signed)
[facility] Provider Note   CSN: [id number] Arrival date & time: 09/01/16  1851     History   Chief Complaint Chief Complaint  Patient presents with  . Abdominal Pain    possible miscarriage    HPI Colleen Wiggins is a 41 y.o. female.  HPI Patient is a G3 P1 A1 who presents to the emergency department approximately 8-[redacted] weeks pregnant.  She states that she took a home pregnancy test.  She has not had any formal obstetrical care to this point.  She began having vaginal bleeding today and is concerned she might be having a miscarriage.  She reports lower crampy abdominal pai   Past Medical History:  Diagnosis Date  . Gestational diabetes   . Medical history non-contributory     Patient Active Problem List   Diagnosis Date Noted  . Essential hypertension 12/04/2013    Past Surgical History:  Procedure Laterality Date  . APPENDECTOMY    . TONSILLECTOMY      OB History    Gravida Para Term Preterm AB Living   2 1 1     1    SAB TAB Ectopic Multiple Live Births           1       Home Medications    Prior to Admission medications   Medication Sig Start Date End Date Taking? Authorizing Provider  ACCU-CHEK FASTCLIX LANCETS MISC Inject 1 each into the skin 4 (four) times daily. 409.81 for testing 4 times daily Patient not taking: Reported on 09/01/2016 06/03/13   Adam Phenix, MD  docusate sodium (COLACE) 100 MG capsule Take 1 capsule (100 mg total) by mouth 2 (two) times daily. Patient not taking: Reported on 09/01/2016 11/09/13   Nani Ravens, MD  glucose blood [facility] ACTIVE STRIPS) test strip Use as instructed Patient not taking: Reported on 09/01/2016 06/03/13   Adam Phenix, MD  glyBURIDE (DIABETA) 5 MG tablet Take 2 tablets (10 mg total) by mouth 2 (two) times daily. Patient not taking: Reported on 09/01/2016 08/19/13   Lesly Dukes, MD  hydrochlorothiazide (HYDRODIURIL) 25 MG tablet Take 1 tablet (25 mg total) by mouth daily. Patient not taking:  Reported on 09/01/2016 12/04/13   Tereso Newcomer, MD  HYDROcodone-acetaminophen (NORCO/VICODIN) 5-325 MG tablet Take 1 tablet by mouth every 4 (four) hours as needed for moderate pain. 09/01/16   Azalia Bilis, MD  ibuprofen (ADVIL,MOTRIN) 600 MG tablet Take 1 tablet (600 mg total) by mouth every 6 (six) hours. Patient not taking: Reported on 09/01/2016 11/09/13   Nani Ravens, MD    Family History Family History  Problem Relation Age of Onset  . Diabetes Father   . Diabetes Paternal Grandmother     Social History Social History  Substance Use Topics  . Smoking status: Current Every Day Smoker    Packs/day: 0.50  . Smokeless tobacco: Never Used  . Alcohol use No     Allergies   Patient has no known allergies.   Review of Systems Review of Systems  All other systems reviewed and are negative.    Physical Exam Updated Vital Signs BP 121/74 (BP Location: Right Arm)   Pulse 81   Temp 98.3 F (36.8 C) (Oral)   Resp 16   Ht 5\' 11"  (1.803 m)   Wt 127 kg (280 lb)   LMP 06/17/2016   SpO2 100%   BMI 39.05 kg/m   Physical Exam  Constitutional: She is oriented to person,  place, and time. She appears well-developed and well-nourished. No distress.  HENT:  Head: Normocephalic and atraumatic.  Eyes: EOM are normal.  Neck: Normal range of motion.  Cardiovascular: Normal rate, regular rhythm and normal heart sounds.   Pulmonary/Chest: Effort normal and breath sounds normal.  Abdominal: Soft. She exhibits no distension. There is no tenderness.  Musculoskeletal: Normal range of motion.  Neurological: She is alert and oriented to person, place, and time.  Skin: Skin is warm and dry.  Psychiatric: She has a normal mood and affect. Judgment normal.  Nursing note and vitals reviewed.    ED Treatments / Results  Labs (all labs ordered are listed, but only abnormal results are displayed) Labs Reviewed  CBC WITH DIFFERENTIAL/PLATELET - Abnormal; Notable for the following:        Result Value   WBC 11.1 (*)    Neutro Abs 7.8 (*)    All other components within normal limits  BASIC METABOLIC PANEL - Abnormal; Notable for the following:    Sodium 134 (*)    Calcium 8.8 (*)    All other components within normal limits  HCG, QUANTITATIVE, PREGNANCY - Abnormal; Notable for the following:    hCG, Beta Chain, Quant, S 3,913 (*)    All other components within normal limits  I-STAT BETA HCG BLOOD, ED ([facility], [facility], [facility] ONLY) - Abnormal; Notable for the following:    I-stat hCG, quantitative >2,000.0 (*)    All other components within normal limits    EKG  EKG Interpretation None       Radiology [place] Ob Comp < 14 Wks  Result Date: 09/01/2016 CLINICAL DATA:  41 year old female reportedly [redacted] weeks pregnant with heavy vaginal bleeding. Quantitative beta HCG 72,000 EXAM: OBSTETRIC <14 WK US AND TRANSVAGINAL OB US TECHNIQUE: Both transabdominal and transvaginal ultrasound examinations were performed for complete evaluation of the gestation as well as the maternal uterus, adnexal regions, and pelvic cul-de-sac. Transvaginal technique was performed to assess early pregnancy. COMPARISON:  Prior obstetrical ultrasound 11/01/2013 FINDINGS: Intrauterine gestational sac: None Yolk sac:  Not Visualized. Embryo:  Not Visualized. Cardiac Activity: Not Visualized. Subchorionic hemorrhage:  None visualized. Maternal uterus/adnexae: The right ovary is normal in appearance measuring 2.7 x 1.8 x 2.2 cm. The left ovary is also normal in appearance measuring 4.8 x 3.1 x 3.4 cm. Simple follicular cyst on the left measuring up to 2.7 cm. No free fluid. IMPRESSION: No intrauterine gestational sac, yolk sac or embryo identified. Differential considerations include failed intrauterine pregnancy, and ectopic pregnancy although no definitive ectopic is identified. Recommend repeat quantitative beta HCG in 48 hours. Electronically Signed   By: Malachy MoanHeath  McCullough M.D.   On: 09/01/2016 20:50   [place] Ob  Transvaginal  Result Date: 09/01/2016 CLINICAL DATA:  41 year old female reportedly [redacted] weeks pregnant with heavy vaginal bleeding. Quantitative beta HCG 72,000 EXAM: OBSTETRIC <14 WK US AND TRANSVAGINAL OB US TECHNIQUE: Both transabdominal and transvaginal ultrasound examinations were performed for complete evaluation of the gestation as well as the maternal uterus, adnexal regions, and pelvic cul-de-sac. Transvaginal technique was performed to assess early pregnancy. COMPARISON:  Prior obstetrical ultrasound 11/01/2013 FINDINGS: Intrauterine gestational sac: None Yolk sac:  Not Visualized. Embryo:  Not Visualized. Cardiac Activity: Not Visualized. Subchorionic hemorrhage:  None visualized. Maternal uterus/adnexae: The right ovary is normal in appearance measuring 2.7 x 1.8 x 2.2 cm. The left ovary is also normal in appearance measuring 4.8 x 3.1 x 3.4 cm. Simple follicular cyst on the left measuring up to  2.7 cm. No free fluid. IMPRESSION: No intrauterine gestational sac, yolk sac or embryo identified. Differential considerations include failed intrauterine pregnancy, and ectopic pregnancy although no definitive ectopic is identified. Recommend repeat quantitative beta HCG in 48 hours. Electronically Signed   By: Malachy MoanHeath  McCullough M.D.   On: 09/01/2016 20:50    Procedures Procedures (including critical care time)  Medications Ordered in ED Medications  oxyCODONE-acetaminophen (PERCOCET/ROXICET) 5-325 MG per tablet 2 tablet (2 tablets Oral Given 09/01/16 2009)  oxyCODONE-acetaminophen (PERCOCET/ROXICET) 5-325 MG per tablet 1 tablet (1 tablet Oral Given 09/01/16 2253)     Initial Impression / Assessment and Plan / ED Course  I have reviewed the triage vital signs and the nursing notes.  Pertinent labs & imaging results that were available during my care of the patient were reviewed by me and considered in my medical decision making (see chart for details).     Suspect near complete miscarriage.   Ultrasound demonstrates no overt signs of ectopic pregnancy.  Her vital signs are stable. Uterus is empty.  Sharene ButtersQuant today is nearly 4000.  She'll need repive in 48 hours to confirm that this is decreasing.  Ectopic precautions given.  Final Clinical Impressions(s) / ED Diagnoses   Final diagnoses:  Miscarriage, threatened, early pregnancy    New Prescriptions Discharge Medication List as of 09/01/2016 10:46 PM    START taking these medications   Details  HYDROcodone-acetaminophen (NORCO/VICODIN) 5-325 MG tablet Take 1 tablet by mouth every 4 (four) hours as needed for moderate pain., Starting Thu 09/01/2016, Print         Azalia Bilisampos, [place], MD 09/02/16 [phone]

## 2017-09-29 ENCOUNTER — Encounter (HOSPITAL_COMMUNITY): Payer: Self-pay

## 2017-09-29 ENCOUNTER — Ambulatory Visit (HOSPITAL_COMMUNITY)
Admission: EM | Admit: 2017-09-29 | Discharge: 2017-09-29 | Disposition: A | Discharge status: Home / Self Care | Payer: Medicaid Other | Attending: Internal Medicine | Admitting: Internal Medicine

## 2017-09-29 DIAGNOSIS — J40 Bronchitis, not specified as acute or chronic: Secondary | ICD-10-CM | POA: Diagnosis not present

## 2017-09-29 MED ORDER — IPRATROPIUM BROMIDE 0.06 % NA SOLN: 2.0000 | Freq: Four times a day (QID) | NASAL | 0 refills | Status: DC

## 2017-09-29 MED ORDER — CETIRIZINE HCL 10 MG PO TABS: 10.0000 mg | ORAL_TABLET | Freq: Every day | ORAL | 0 refills | Status: DC

## 2017-09-29 MED ORDER — AZITHROMYCIN 250 MG PO TABS: 250.0000 mg | ORAL_TABLET | Freq: Every day | ORAL | 0 refills | Status: DC

## 2017-09-29 MED ORDER — FLUTICASONE PROPIONATE 50 MCG/ACT NA SUSP: 2.0000 | Freq: Every day | NASAL | 0 refills | Status: DC

## 2017-09-29 MED ORDER — PREDNISONE 50 MG PO TABS: 50.0000 mg | ORAL_TABLET | Freq: Every day | ORAL | 0 refills | Status: DC

## 2017-09-29 NOTE — Discharge Instructions (Signed)
Azithromycin and prednisone as directed for bronchitis and cough. Start flonase, atrovent nasal spray, zyrtec for nasal congestion/drainage. You can use over the counter nasal saline rinse such as neti pot for nasal congestion. Keep hydrated, your urine should be clear to pale yellow in color. Tylenol/motrin for fever and pain. Monitor for any worsening of symptoms, chest pain, shortness of breath, wheezing, swelling of the throat, follow up for reevaluation.   For sore throat/cough try using a honey-based tea. Use 3 teaspoons of honey with juice squeezed from half lemon. Place shaved pieces of ginger into 1/2-1 cup of water and warm over stove top. Then mix the ingredients and repeat every 4 hours as needed.

## 2017-09-29 NOTE — ED Provider Notes (Signed)
[facility]    CSN: [id number] Arrival date & time: 09/29/17  1518     History   Chief Complaint Chief Complaint  Patient presents with  . URI    HPI Colleen Wiggins is a 42 y.o. female.   42 year old female comes in for 1 week history of URI symptoms.  Has had productive cough, rhinorrhea, nasal congestion.  Denies sore throat.  Subjective fever with chills.  Denies chest pain, shortness of breath.  Does feel mild wheezing.  No obvious sick contact.  She is a current everyday smoker, 10-year pack history.  Has not tried anything for the symptoms.     Past Medical History:  Diagnosis Date  . Gestational diabetes   . Medical history non-contributory     Patient Active Problem List   Diagnosis Date Noted  . Essential hypertension 12/04/2013    Past Surgical History:  Procedure Laterality Date  . APPENDECTOMY    . TONSILLECTOMY      OB History    Gravida  2   Para  1   Term  1   Preterm      AB      Living  1     SAB      TAB      Ectopic      Multiple      Live Births  1            Home Medications    Prior to Admission medications   Medication Sig Start Date End Date Taking? Authorizing Provider  azithromycin (ZITHROMAX) 250 MG tablet Take 1 tablet (250 mg total) by mouth daily. Take first 2 tablets together, then 1 every day until finished. 09/29/17   Cathie HoopsYu, Rogan Ecklund V, PA-C  cetirizine (ZYRTEC) 10 MG tablet Take 1 tablet (10 mg total) by mouth daily. 09/29/17   Cathie HoopsYu, Azalea Cedar V, PA-C  fluticasone (FLONASE) 50 MCG/ACT nasal spray Place 2 sprays into both nostrils daily. 09/29/17   Cathie HoopsYu, Mercedes Valeriano V, PA-C  ipratropium (ATROVENT) 0.06 % nasal spray Place 2 sprays into both nostrils 4 (four) times daily. 09/29/17   Cathie HoopsYu, Eunice Winecoff V, PA-C  predniSONE (DELTASONE) 50 MG tablet Take 1 tablet (50 mg total) by mouth daily. 09/29/17   Cathie HoopsYu, Hamsa Laurich V, PA-C  norgestrel-ethinyl estradiol (LO/OVRAL) 0.3-30 MG-MCG tablet Take 1 tablet by mouth daily. 10/05/10 04/18/11  Elson AreasSofia, Leslie  K, PA-C    Family History Family History  Problem Relation Age of Onset  . Diabetes Father   . Diabetes Paternal Grandmother     Social History Social History   Tobacco Use  . Smoking status: Current Every Day Smoker    Packs/day: 0.50  . Smokeless tobacco: Never Used  Substance Use Topics  . Alcohol use: No  . Drug use: No     Allergies   Patient has no known allergies.   Review of Systems Review of Systems  Reason unable to perform ROS: See HPI as above.     Physical Exam Triage Vital Signs ED Triage Vitals [09/29/17 1606]  Enc Vitals Group     BP 130/79     Pulse Rate 74     Resp 20     Temp 98.9 F (37.2 C)     Temp Source Oral     SpO2 95 %     Weight      Height      Head Circumference      Peak Flow      Pain  Score      Pain Loc      Pain Edu?      Excl. in GC?    No data found.  Updated Vital Signs BP 130/79 (BP Location: Left Arm)   Pulse 74   Temp 98.9 F (37.2 C) (Oral)   Resp 20   LMP 09/05/2017   SpO2 95%   Breastfeeding? Unknown   Physical Exam  Constitutional: She is oriented to person, place, and time. She appears well-developed and well-nourished. No distress.  HENT:  Head: Normocephalic and atraumatic.  Right Ear: Tympanic membrane, external ear and ear canal normal. Tympanic membrane is not erythematous and not bulging.  Left Ear: External ear and ear canal normal. Tympanic membrane is erythematous. Tympanic membrane is not bulging.  Nose: Mucosal edema and rhinorrhea present. Right sinus exhibits no maxillary sinus tenderness and no frontal sinus tenderness. Left sinus exhibits no maxillary sinus tenderness and no frontal sinus tenderness.  Mouth/Throat: Uvula is midline, oropharynx is clear and moist and mucous membranes are normal.  Eyes: Pupils are equal, round, and reactive to light. Conjunctivae are normal.  Neck: Normal range of motion. Neck supple.  Cardiovascular: Normal rate, regular rhythm and normal heart  sounds. Exam reveals no gallop and no friction rub.  No murmur heard. Pulmonary/Chest: Effort normal and breath sounds normal. She has no decreased breath sounds. She has no wheezes. She has no rhonchi. She has no rales.  Lymphadenopathy:    She has no cervical adenopathy.  Neurological: She is alert and oriented to person, place, and time.  Skin: Skin is warm and dry.  Psychiatric: She has a normal mood and affect. Her behavior is normal. Judgment normal.    UC Treatments / Results  Labs (all labs ordered are listed, but only abnormal results are displayed) Labs Reviewed - No data to display  EKG None  Radiology No results found.  Procedures Procedures (including critical care time)  Medications Ordered in UC Medications - No data to display  Initial Impression / Assessment and Plan / UC Course  I have reviewed the triage vital signs and the nursing notes.  Pertinent labs & imaging results that were available during my care of the patient were reviewed by me and considered in my medical decision making (see chart for details).    Azithromycin and prednisone for bronchitis.  Other symptomatic treatment discussed.  Return precautions given.  Patient expresses understanding and agrees to plan.  Final Clinical Impressions(s) / UC Diagnoses   Final diagnoses:  Bronchitis    ED Prescriptions    Medication Sig Dispense Auth. Provider   predniSONE (DELTASONE) 50 MG tablet Take 1 tablet (50 mg total) by mouth daily. 5 tablet Jeneva Schweizer V, PA-C   azithromycin (ZITHROMAX) 250 MG tablet Take 1 tablet (250 mg total) by mouth daily. Take first 2 tablets together, then 1 every day until finished. 6 tablet Aziza Stuckert V, PA-C   fluticasone (FLONASE) 50 MCG/ACT nasal spray Place 2 sprays into both nostrils daily. 1 g Yeily Link V, PA-C   ipratropium (ATROVENT) 0.06 % nasal spray Place 2 sprays into both nostrils 4 (four) times daily. 15 mL Marne Meline V, PA-C   cetirizine (ZYRTEC) 10 MG tablet Take  1 tablet (10 mg total) by mouth daily. 15 tablet Threasa Alpha, [place] 09/29/17 [phone]

## 2017-09-29 NOTE — ED Triage Notes (Signed)
Pt presents with cold symptoms; congestion, productive cough, fever and body aches.

## 2018-10-28 SURGERY — Surgical Case
Anesthesia: *Unknown

## 2022-08-10 ENCOUNTER — Ambulatory Visit (HOSPITAL_COMMUNITY): Admission: EM | Admit: 2022-08-10 | Discharge status: Absent without leave | Payer: Self-pay

## 2022-08-10 ENCOUNTER — Ambulatory Visit (HOSPITAL_COMMUNITY)
Admission: EM | Admit: 2022-08-10 | Discharge: 2022-08-10 | Disposition: A | Discharge status: Home / Self Care | Payer: 59 | Attending: Family Medicine | Admitting: Family Medicine

## 2022-08-10 ENCOUNTER — Encounter (HOSPITAL_COMMUNITY): Payer: Self-pay | Admitting: Emergency Medicine

## 2022-08-10 DIAGNOSIS — H66002 Acute suppurative otitis media without spontaneous rupture of ear drum, left ear: Secondary | ICD-10-CM | POA: Diagnosis not present

## 2022-08-10 DIAGNOSIS — J302 Other seasonal allergic rhinitis: Secondary | ICD-10-CM

## 2022-08-10 DIAGNOSIS — H9202 Otalgia, left ear: Secondary | ICD-10-CM | POA: Diagnosis not present

## 2022-08-10 MED ORDER — AMOXICILLIN 875 MG PO TABS: 875.0000 mg | ORAL_TABLET | Freq: Two times a day (BID) | ORAL | 0 refills | Status: AC

## 2022-08-10 MED ORDER — LAMOTRIGINE 200 MG PO TABS: 200.0000 mg | ORAL_TABLET | Freq: Every day | ORAL | 0 refills | Status: DC

## 2022-08-10 MED ORDER — AZELASTINE-FLUTICASONE 137-50 MCG/ACT NA SUSP: NASAL | 5 refills | Status: DC

## 2022-08-10 NOTE — ED Triage Notes (Signed)
For three months having allergies: post nasal drip, congestion, etc. For 3 days having clicking noise and pain left ear. Put peroxide, rubbing alcohol.   Pt needing refill of Lamictal.

## 2022-08-11 NOTE — ED Provider Notes (Signed)
[facility]   [id number] 08/10/22 Arrival Time: 1832  ASSESSMENT & PLAN:  1. Otalgia of left ear   2. Non-recurrent acute suppurative otitis media of left ear without spontaneous rupture of tympanic membrane   3. Seasonal allergies    Lamictal refilled at pt request. To f/u with [facility]. Information given. Amox for ear infection. Nasal spray for allergies.  Meds ordered this encounter  Medications   lamoTRIgine (LAMICTAL) 200 MG tablet    Sig: Take 1 tablet (200 mg total) by mouth daily.    Dispense:  30 tablet    Refill:  0   Azelastine-Fluticasone 137-50 MCG/ACT SUSP    Sig: One spray each nostril twice daily.    Dispense:  23 g    Refill:  5   amoxicillin (AMOXIL) 875 MG tablet    Sig: Take 1 tablet (875 mg total) by mouth 2 (two) times daily for 7 days.    Dispense:  14 tablet    Refill:  0   Is looking to est care with PCP.  Reviewed expectations re: course of current medical issues. Questions answered. Outlined signs and symptoms indicating need for more acute intervention. Patient verbalized understanding. After Visit Summary given.   SUBJECTIVE: History from: patient.  Colleen Wiggins is a 47 y.o. female who presents with complaint of allergies: post nasal drip, congestion, etc; past 3 months. For 3 days having clicking noise and pain left ear. Put peroxide, rubbing alcohol. No ear drainage or bleeding.  Pt needing refill of Lamictal.  No current PCP.  Social History   Tobacco Use  Smoking Status Every Day   Current packs/day: 0.50   Types: Cigarettes  Smokeless Tobacco Never    OBJECTIVE:  Vitals:   08/10/22 1849  BP: (!) 150/88  Pulse: 65  Resp: 18  Temp: 99 F (37.2 C)  TempSrc: Oral  SpO2: 95%     General appearance: alert; appears fatigued Ear Canal: normal Ear: L TM with erythema and bulging Neck: supple without LAD Lungs: unlabored respirations, symmetrical air entry; cough: mild; no respiratory distress Skin:  warm and dry Psychological: alert and cooperative; normal mood and affect  No Known Allergies  Past Medical History:  Diagnosis Date   Gestational diabetes    Medical history non-contributory    Family History  Problem Relation Age of Onset   Diabetes Father    Diabetes Paternal Grandmother    Social History   Socioeconomic History   Marital status: Single    Spouse name: Not on file   Number of children: Not on file   Years of education: Not on file   Highest education level: Not on file  Occupational History   Not on file  Tobacco Use   Smoking status: Every Day    Current packs/day: 0.50    Types: Cigarettes   Smokeless tobacco: Never  Substance and Sexual Activity   Alcohol use: No   Drug use: No   Sexual activity: Yes    Birth control/protection: None  Other Topics Concern   Not on file  Social History Narrative   Not on file   Social Determinants of Health   Financial Resource Strain: Not on file  Food Insecurity: Not on file  Transportation Needs: Not on file  Physical Activity: Not on file  Stress: Not on file  Social Connections: Not on file  Intimate Partner Violence: Not on file             [place],  MD 08/11/22 1610

## 2022-11-10 ENCOUNTER — Ambulatory Visit: Payer: 59 | Admitting: Nurse Practitioner

## 2022-11-10 VITALS — BP 141/88 | HR 65 | Ht 71.0 in | Wt 316.0 lb

## 2022-11-10 DIAGNOSIS — E66813 Obesity, class 3: Secondary | ICD-10-CM | POA: Diagnosis not present

## 2022-11-10 DIAGNOSIS — Z0000 Encounter for general adult medical examination without abnormal findings: Secondary | ICD-10-CM

## 2022-11-10 DIAGNOSIS — F319 Bipolar disorder, unspecified: Secondary | ICD-10-CM | POA: Diagnosis not present

## 2022-11-10 DIAGNOSIS — Z6841 Body Mass Index (BMI) 40.0 and over, adult: Secondary | ICD-10-CM

## 2022-11-10 DIAGNOSIS — Z1329 Encounter for screening for other suspected endocrine disorder: Secondary | ICD-10-CM

## 2022-11-10 DIAGNOSIS — Z13220 Encounter for screening for lipoid disorders: Secondary | ICD-10-CM

## 2022-11-10 DIAGNOSIS — F419 Anxiety disorder, unspecified: Secondary | ICD-10-CM | POA: Diagnosis not present

## 2022-11-10 LAB — POCT GLYCOSYLATED HEMOGLOBIN (HGB A1C): Hemoglobin A1C: 5.9 % — AB (ref 4.0–5.6)

## 2022-11-10 MED ORDER — TRIAMCINOLONE ACETONIDE 55 MCG/ACT NA AERO: 2.0000 | INHALATION_SPRAY | Freq: Every day | NASAL | 12 refills | Status: AC

## 2022-11-10 MED ORDER — LISINOPRIL 20 MG PO TABS: 20.0000 mg | ORAL_TABLET | Freq: Every day | ORAL | 1 refills | Status: DC

## 2022-11-10 MED ORDER — FLUOXETINE HCL 20 MG PO CAPS: 20.0000 mg | ORAL_CAPSULE | Freq: Every day | ORAL | 1 refills | Status: DC

## 2022-11-10 MED ORDER — LISINOPRIL-HYDROCHLOROTHIAZIDE 20-12.5 MG PO TABS: 1.0000 | ORAL_TABLET | Freq: Every day | ORAL | 3 refills | Status: DC

## 2022-11-10 MED ORDER — PROPRANOLOL HCL 60 MG PO TABS: 60.0000 mg | ORAL_TABLET | Freq: Every day | ORAL | 1 refills | Status: DC

## 2022-11-10 MED ORDER — LAMOTRIGINE 200 MG PO TABS: 200.0000 mg | ORAL_TABLET | Freq: Every day | ORAL | 1 refills | Status: DC

## 2022-11-10 NOTE — Addendum Note (Signed)
Addended by: Merrilyn Puma on: 11/10/2022 04:07 PM   Modules accepted: Orders

## 2022-11-10 NOTE — Progress Notes (Signed)
Subjective   Patient ID: Colleen Wiggins, female    DOB: [birth year], 47 y.o.   MRN: [id number]  Chief Complaint  Patient presents with   Establish Care    Referring provider: No ref. provider found  Colleen Wiggins is a 47 y.o. female with Past Medical History: No date: Gestational diabetes No date: Medical history non-contributory   HPI  Patient presents today to establish care.  She would like blood work done today.  She is concerned about diabetes because it runs in her family.  A1c in office today was 5.9.  We did discuss healthy diabetic diet would probably be best for her.  We will refer her to weight management for obesity.  She also needs a referral to psychiatry for history of bipolar and anxiety.  We will refill her medications for her today.  Patient's blood pressure was borderline in office today and has been elevated at home as well.  We will add HCTZ to lisinopril. Denies f/c/s, n/v/d, hemoptysis, PND, leg swelling Denies chest pain or edema     No Known Allergies  Immunization History  Administered Date(s) Administered   Tdap 08/19/2013    Tobacco History: Social History   Tobacco Use  Smoking Status Every Day   Current packs/day: 0.50   Types: Cigarettes  Smokeless Tobacco Never   Ready to quit: Not Answered Counseling given: Not Answered   Outpatient Encounter Medications as of 11/10/2022  Medication Sig   lisinopril-hydrochlorothiazide (ZESTORETIC) 20-12.5 MG tablet Take 1 tablet by mouth daily.   triamcinolone (NASACORT) 55 MCG/ACT AERO nasal inhaler Place 2 sprays into the nose daily.   Azelastine-Fluticasone 137-50 MCG/ACT SUSP One spray each nostril twice daily.   cetirizine (ZYRTEC) 10 MG tablet Take 1 tablet (10 mg total) by mouth daily.   FLUoxetine (PROZAC) 20 MG capsule Take 1 capsule (20 mg total) by mouth daily.   lamoTRIgine (LAMICTAL) 200 MG tablet Take 1 tablet (200 mg total) by mouth daily.   propranolol (INDERAL) 60 MG tablet  Take 1 tablet (60 mg total) by mouth daily.   [DISCONTINUED] FLUoxetine (PROZAC) 20 MG capsule Take 20 mg by mouth daily.   [DISCONTINUED] lamoTRIgine (LAMICTAL) 200 MG tablet Take 1 tablet (200 mg total) by mouth daily.   [DISCONTINUED] lisinopril (ZESTRIL) 20 MG tablet Take 20 mg by mouth daily.   [DISCONTINUED] lisinopril (ZESTRIL) 20 MG tablet Take 1 tablet (20 mg total) by mouth daily.   [DISCONTINUED] norgestrel-ethinyl estradiol (LO/OVRAL) 0.3-30 MG-MCG tablet Take 1 tablet by mouth daily.   [DISCONTINUED] propranolol (INDERAL) 60 MG tablet Take 60 mg by mouth daily.   No facility-administered encounter medications on file as of 11/10/2022.    Review of Systems  Review of Systems  Constitutional: Negative.   HENT: Negative.    Cardiovascular: Negative.   Gastrointestinal: Negative.   Allergic/Immunologic: Negative.   Neurological: Negative.   Psychiatric/Behavioral: Negative.       Objective:   BP (!) 141/88 (BP Location: Left Arm, Patient Position: Sitting, Cuff Size: Large)   Pulse 65   Ht 5\' 11"  (1.803 m)   Wt (!) 316 lb (143.3 kg)   SpO2 95%   BMI 44.07 kg/m   Wt Readings from Last 5 Encounters:  11/10/22 (!) 316 lb (143.3 kg)  09/01/16 280 lb (127 kg)  12/19/13 269 lb 9.6 oz (122.3 kg)  12/04/13 264 lb (119.7 kg)  11/06/13 280 lb (127 kg)     Physical Exam Vitals and nursing note reviewed.  Constitutional:  General: She is not in acute distress.    Appearance: She is well-developed.  Cardiovascular:     Rate and Rhythm: Normal rate and regular rhythm.  Pulmonary:     Effort: Pulmonary effort is normal.     Breath sounds: Normal breath sounds.  Neurological:     Mental Status: She is alert and oriented to person, place, and time.       Assessment & Plan:   Bipolar 1 disorder (HCC) -     lamoTRIgine; Take 1 tablet (200 mg total) by mouth daily.  Dispense: 90 tablet; Refill: 1 -     Propranolol HCl; Take 1 tablet (60 mg total) by mouth  daily.  Dispense: 90 tablet; Refill: 1 -     FLUoxetine HCl; Take 1 capsule (20 mg total) by mouth daily.  Dispense: 30 capsule; Refill: 1 -     Ambulatory referral to Psychiatry  Anxiety -     lamoTRIgine; Take 1 tablet (200 mg total) by mouth daily.  Dispense: 90 tablet; Refill: 1 -     Propranolol HCl; Take 1 tablet (60 mg total) by mouth daily.  Dispense: 90 tablet; Refill: 1 -     FLUoxetine HCl; Take 1 capsule (20 mg total) by mouth daily.  Dispense: 30 capsule; Refill: 1 -     Ambulatory referral to Psychiatry  Lipid screening -     Lipid panel  Thyroid disorder screen -     TSH  Routine adult health maintenance -     CBC -     Comprehensive metabolic panel  Class 3 severe obesity due to excess calories without serious comorbidity with body mass index (BMI) of 40.0 to 44.9 in adult (HCC) -     Amb Ref to Medical Weight Management  Other orders -     Triamcinolone Acetonide; Place 2 sprays into the nose daily.  Dispense: 1 each; Refill: 12 -     Lisinopril-hydroCHLOROthiazide; Take 1 tablet by mouth daily.  Dispense: 90 tablet; Refill: 3     Return in about 3 months (around 02/10/2023).   Ivonne Andrew, NP 11/10/2022

## 2022-11-10 NOTE — Patient Instructions (Addendum)
1. Bipolar 1 disorder (HCC)  - lamoTRIgine (LAMICTAL) 200 MG tablet; Take 1 tablet (200 mg total) by mouth daily.  Dispense: 90 tablet; Refill: 1 - propranolol (INDERAL) 60 MG tablet; Take 1 tablet (60 mg total) by mouth daily.  Dispense: 90 tablet; Refill: 1 - FLUoxetine (PROZAC) 20 MG capsule; Take 1 capsule (20 mg total) by mouth daily.  Dispense: 30 capsule; Refill: 1 - Ambulatory referral to Psychiatry  2. Anxiety  - lamoTRIgine (LAMICTAL) 200 MG tablet; Take 1 tablet (200 mg total) by mouth daily.  Dispense: 90 tablet; Refill: 1 - propranolol (INDERAL) 60 MG tablet; Take 1 tablet (60 mg total) by mouth daily.  Dispense: 90 tablet; Refill: 1 - FLUoxetine (PROZAC) 20 MG capsule; Take 1 capsule (20 mg total) by mouth daily.  Dispense: 30 capsule; Refill: 1 - Ambulatory referral to Psychiatry  Follow up:  Follow up in 3 months

## 2022-11-11 LAB — CBC
Hematocrit: 41.6 % (ref 34.0–46.6)
Hemoglobin: 13.6 g/dL (ref 11.1–15.9)
MCH: 30.4 pg (ref 26.6–33.0)
MCHC: 32.7 g/dL (ref 31.5–35.7)
MCV: 93 fL (ref 79–97)
Platelets: 366 10*3/uL (ref 150–450)
RBC: 4.47 x10E6/uL (ref 3.77–5.28)
RDW: 13 % (ref 11.7–15.4)
WBC: 8.1 10*3/uL (ref 3.4–10.8)

## 2022-11-11 LAB — LIPID PANEL
Chol/HDL Ratio: 3.6 {ratio} (ref 0.0–4.4)
Cholesterol, Total: 206 mg/dL — ABNORMAL HIGH (ref 100–199)
HDL: 58 mg/dL (ref >39)
LDL Chol Calc (NIH): 135 mg/dL — ABNORMAL HIGH (ref 0–99)
Triglycerides: 75 mg/dL (ref 0–149)
VLDL Cholesterol Cal: 13 mg/dL (ref 5–40)

## 2022-11-11 LAB — COMPREHENSIVE METABOLIC PANEL
ALT: 11 [IU]/L (ref 0–32)
AST: 10 [IU]/L (ref 0–40)
Albumin: 4 g/dL (ref 3.9–4.9)
Alkaline Phosphatase: 91 [IU]/L (ref 44–121)
BUN/Creatinine Ratio: 8 — ABNORMAL LOW (ref 9–23)
BUN: 7 mg/dL (ref 6–24)
Bilirubin Total: 0.4 mg/dL (ref 0.0–1.2)
CO2: 20 mmol/L (ref 20–29)
Calcium: 8.9 mg/dL (ref 8.7–10.2)
Chloride: 104 mmol/L (ref 96–106)
Creatinine, Ser: 0.87 mg/dL (ref 0.57–1.00)
Globulin, Total: 2.4 g/dL (ref 1.5–4.5)
Glucose: 93 mg/dL (ref 70–99)
Potassium: 4.3 mmol/L (ref 3.5–5.2)
Sodium: 140 mmol/L (ref 134–144)
Total Protein: 6.4 g/dL (ref 6.0–8.5)
eGFR: 84 mL/min/{1.73_m2} (ref >59)

## 2022-11-11 LAB — TSH: TSH: 1.96 u[IU]/mL (ref 0.450–4.500)

## 2022-11-12 ENCOUNTER — Emergency Department (HOSPITAL_BASED_OUTPATIENT_CLINIC_OR_DEPARTMENT_OTHER)
Admission: EM | Admit: 2022-11-12 | Discharge: 2022-11-12 | Disposition: A | Discharge status: Home / Self Care | Payer: 59 | Attending: Emergency Medicine | Admitting: Emergency Medicine

## 2022-11-12 ENCOUNTER — Encounter (HOSPITAL_BASED_OUTPATIENT_CLINIC_OR_DEPARTMENT_OTHER): Payer: Self-pay

## 2022-11-12 ENCOUNTER — Other Ambulatory Visit: Payer: Self-pay

## 2022-11-12 DIAGNOSIS — S46812A Strain of other muscles, fascia and tendons at shoulder and upper arm level, left arm, initial encounter: Secondary | ICD-10-CM | POA: Diagnosis not present

## 2022-11-12 DIAGNOSIS — X58XXXA Exposure to other specified factors, initial encounter: Secondary | ICD-10-CM | POA: Diagnosis not present

## 2022-11-12 DIAGNOSIS — S4992XA Unspecified injury of left shoulder and upper arm, initial encounter: Secondary | ICD-10-CM | POA: Diagnosis present

## 2022-11-12 DIAGNOSIS — R519 Headache, unspecified: Secondary | ICD-10-CM | POA: Insufficient documentation

## 2022-11-12 DIAGNOSIS — Z79899 Other long term (current) drug therapy: Secondary | ICD-10-CM | POA: Insufficient documentation

## 2022-11-12 MED ORDER — ACETAMINOPHEN 500 MG PO TABS
1000.0000 mg | ORAL_TABLET | Freq: Once | ORAL | Status: AC
  Administered 2022-11-12: 1000 mg via ORAL
  Filled 2022-11-12: qty 2

## 2022-11-12 MED ORDER — KETOROLAC TROMETHAMINE 15 MG/ML IJ SOLN
15.0000 mg | Freq: Once | INTRAMUSCULAR | Status: AC
  Administered 2022-11-12: 15 mg via INTRAMUSCULAR
  Filled 2022-11-12: qty 1

## 2022-11-12 MED ORDER — DIAZEPAM 5 MG PO TABS
5.0000 mg | ORAL_TABLET | Freq: Once | ORAL | Status: AC
  Administered 2022-11-12: 5 mg via ORAL
  Filled 2022-11-12: qty 1

## 2022-11-12 NOTE — ED Notes (Signed)
Reviewed AVS with patient, patient expressed understanding of directions, denies further questions at this time. 

## 2022-11-12 NOTE — ED Provider Notes (Signed)
[place] EMERGENCY DEPARTMENT AT [facility] Provider Note   CSN: [id number] Arrival date & time: 11/12/22  0443     History  Chief Complaint  Patient presents with   Headache    Colleen Wiggins is a 47 y.o. female.  47 year old female with a chief complaints of left-sided neck pain.  Going on for a few days.  She realized that it started hurting on her head this evening and decided to come to the emergency department for evaluation.  She tells me that she was looking up on Web MD and was worried about what it might be.  She denies any injury to her head or neck denies one-sided numbness or weakness denies difficulty speech or swallowing.  No vomiting.   Headache      Home Medications Prior to Admission medications   Medication Sig Start Date End Date Taking? Authorizing Provider  FLUoxetine (PROZAC) 20 MG capsule Take 1 capsule (20 mg total) by mouth daily. 11/10/22 02/08/23 Yes Ivonne Andrew, NP  lamoTRIgine (LAMICTAL) 200 MG tablet Take 1 tablet (200 mg total) by mouth daily. 11/10/22  Yes Ivonne Andrew, NP  lisinopril-hydrochlorothiazide (ZESTORETIC) 20-12.5 MG tablet Take 1 tablet by mouth daily. 11/10/22  Yes Ivonne Andrew, NP  propranolol (INDERAL) 60 MG tablet Take 1 tablet (60 mg total) by mouth daily. 11/10/22  Yes Ivonne Andrew, NP  Azelastine-Fluticasone 137-50 MCG/ACT SUSP One spray each nostril twice daily. 08/10/22   Mardella Layman, MD  cetirizine (ZYRTEC) 10 MG tablet Take 1 tablet (10 mg total) by mouth daily. 09/29/17   Cathie Hoops, Amy V, PA-C  triamcinolone (NASACORT) 55 MCG/ACT AERO nasal inhaler Place 2 sprays into the nose daily. 11/10/22   Ivonne Andrew, NP  norgestrel-ethinyl estradiol (LO/OVRAL) 0.3-30 MG-MCG tablet Take 1 tablet by mouth daily. 10/05/10 04/18/11  Elson Areas, PA-C      Allergies    Patient has no known allergies.    Review of Systems   Review of Systems  Neurological:  Positive for headaches.    Physical  Exam Updated Vital Signs BP (!) 147/98   Pulse 64   Temp 98.1 F (36.7 C) (Oral)   Resp 16   Ht 5\' 11"  (1.803 m)   Wt (!) 142 kg   SpO2 98%   BMI 43.65 kg/m  Physical Exam Vitals and nursing note reviewed.  Constitutional:      General: She is not in acute distress.    Appearance: She is well-developed. She is not diaphoretic.  HENT:     Head: Normocephalic and atraumatic.  Eyes:     Pupils: Pupils are equal, round, and reactive to light.  Cardiovascular:     Rate and Rhythm: Normal rate and regular rhythm.     Heart sounds: No murmur heard.    No friction rub. No gallop.  Pulmonary:     Effort: Pulmonary effort is normal.     Breath sounds: No wheezing or rales.  Abdominal:     General: There is no distension.     Palpations: Abdomen is soft.     Tenderness: There is no abdominal tenderness.  Musculoskeletal:        General: No tenderness.     Cervical back: Normal range of motion and neck supple.     Comments: Pain along the left trapezius muscle belly.  Pain with the attachment of the trapezius to the occiput.  Skin:    General: Skin is warm and dry.  Neurological:  Mental Status: She is alert and oriented to person, place, and time.     GCS: GCS eye subscore is 4. GCS verbal subscore is 5. GCS motor subscore is 6.     Cranial Nerves: Cranial nerves 2-12 are intact.     Sensory: Sensation is intact.     Motor: Motor function is intact.     Coordination: Coordination is intact.     Comments: Benign neurologic exam  Psychiatric:        Behavior: Behavior normal.     ED Results / Procedures / Treatments   Labs (all labs ordered are listed, but only abnormal results are displayed) Labs Reviewed - No data to display  EKG None  Radiology No results found.  Procedures Procedures    Medications Ordered in ED Medications  acetaminophen (TYLENOL) tablet 1,000 mg (has no administration in time range)  ketorolac (TORADOL) 15 MG/ML injection 15 mg (has no  administration in time range)  diazepam (VALIUM) tablet 5 mg (has no administration in time range)    ED Course/ Medical Decision Making/ A&P                                 Medical Decision Making Risk OTC drugs. Prescription drug management.   47 yo F with a chief complaints of a left-sided headache.  The patient has been having left-sided neck pain that was worse with certain positions and then felt like it moved to her head.  She was looking this up online and was worried that she had had a stroke came in to be evaluated.  By history and exam the most likely diagnosis would be trapezius muscle spasm.  She has pain at the attachment of the trapezius to the occiput which reproduces her discomfort.  She has a benign neurologic exam.  Will treat supportively.  PCP follow-up.  5:11 AM:  I have discussed the diagnosis/risks/treatment options with the patient and family.  Evaluation and diagnostic testing in the emergency department does not suggest an emergent condition requiring admission or immediate intervention beyond what has been performed at this time.  They will follow up with PCP. We also discussed returning to the ED immediately if new or worsening sx occur. We discussed the sx which are most concerning (e.g., sudden worsening pain, fever, inability to tolerate by mouth) that necessitate immediate return. Medications administered to the patient during their visit and any new prescriptions provided to the patient are listed below.  Medications given during this visit Medications  acetaminophen (TYLENOL) tablet 1,000 mg (has no administration in time range)  ketorolac (TORADOL) 15 MG/ML injection 15 mg (has no administration in time range)  diazepam (VALIUM) tablet 5 mg (has no administration in time range)     The patient appears reasonably screen and/or stabilized for discharge and I doubt any other medical condition or other [facility] requiring further screening, evaluation, or treatment  in the ED at this time prior to discharge.          Final Clinical Impression(s) / ED Diagnoses Final diagnoses:  Trapezius strain, left, initial encounter    Rx / DC Orders ED Discharge Orders     None         Melene Plan, DO 11/12/22 [phone]

## 2022-11-12 NOTE — Discharge Instructions (Addendum)
Take 4 over the counter ibuprofen tablets 3 times a day or 2 over-the-counter naproxen tablets twice a day for pain. Also take tylenol 1000mg (2 extra strength) four times a day.   Follow up with your family doc.

## 2022-11-12 NOTE — ED Triage Notes (Signed)
Headache x a few days, patient states was seen by PMD and they're checking her thyroid. Endorses dizziness. Felt tingling in her hand earlier today but none at this time. States she took 650mg  aspirin PTA.

## 2023-01-01 ENCOUNTER — Other Ambulatory Visit: Payer: Self-pay | Admitting: Nurse Practitioner

## 2023-01-01 DIAGNOSIS — F419 Anxiety disorder, unspecified: Secondary | ICD-10-CM

## 2023-01-01 DIAGNOSIS — F319 Bipolar disorder, unspecified: Secondary | ICD-10-CM

## 2023-02-09 ENCOUNTER — Telehealth: Payer: 59 | Admitting: Nurse Practitioner

## 2023-02-13 ENCOUNTER — Ambulatory Visit: Payer: 59 | Admitting: Nurse Practitioner

## 2023-02-13 ENCOUNTER — Other Ambulatory Visit (HOSPITAL_BASED_OUTPATIENT_CLINIC_OR_DEPARTMENT_OTHER): Payer: Self-pay

## 2023-02-13 ENCOUNTER — Encounter: Payer: Self-pay | Admitting: Nurse Practitioner

## 2023-02-13 VITALS — BP 127/75 | HR 63 | Temp 96.6°F | Wt 313.0 lb

## 2023-02-13 DIAGNOSIS — E66813 Obesity, class 3: Secondary | ICD-10-CM

## 2023-02-13 DIAGNOSIS — F419 Anxiety disorder, unspecified: Secondary | ICD-10-CM | POA: Diagnosis not present

## 2023-02-13 DIAGNOSIS — Z6841 Body Mass Index (BMI) 40.0 and over, adult: Secondary | ICD-10-CM

## 2023-02-13 DIAGNOSIS — F319 Bipolar disorder, unspecified: Secondary | ICD-10-CM | POA: Diagnosis not present

## 2023-02-13 MED ORDER — LAMOTRIGINE 200 MG PO TABS
200.0000 mg | ORAL_TABLET | Freq: Every day | ORAL | 1 refills | Status: DC
  Filled 2023-02-13: qty 90, 90d supply, fill #0

## 2023-02-13 MED ORDER — LAMOTRIGINE 200 MG PO TABS: 200.0000 mg | ORAL_TABLET | Freq: Every day | ORAL | 1 refills | Status: AC

## 2023-02-13 MED ORDER — PROPRANOLOL HCL 60 MG PO TABS: 60.0000 mg | ORAL_TABLET | Freq: Every day | ORAL | 1 refills | Status: AC

## 2023-02-13 MED ORDER — LISINOPRIL-HYDROCHLOROTHIAZIDE 20-12.5 MG PO TABS
1.0000 | ORAL_TABLET | Freq: Every day | ORAL | 3 refills | Status: DC
  Filled 2023-02-13: qty 90, 90d supply, fill #0

## 2023-02-13 MED ORDER — FLUOXETINE HCL 20 MG PO CAPS
20.0000 mg | ORAL_CAPSULE | Freq: Every day | ORAL | 0 refills | Status: DC
  Filled 2023-02-13: qty 90, 90d supply, fill #0

## 2023-02-13 MED ORDER — FLUOXETINE HCL 20 MG PO CAPS: 20.0000 mg | ORAL_CAPSULE | Freq: Every day | ORAL | 0 refills | Status: DC

## 2023-02-13 MED ORDER — LISINOPRIL-HYDROCHLOROTHIAZIDE 20-12.5 MG PO TABS: 1.0000 | ORAL_TABLET | Freq: Every day | ORAL | 3 refills | Status: AC

## 2023-02-13 MED ORDER — PROPRANOLOL HCL 60 MG PO TABS
60.0000 mg | ORAL_TABLET | Freq: Every day | ORAL | 1 refills | Status: DC
  Filled 2023-02-13: qty 90, 90d supply, fill #0

## 2023-02-13 NOTE — Progress Notes (Signed)
 Subjective   Patient ID: Colleen Wiggins, female    DOB: [birth year], 48 y.o.   MRN: [id number]  Chief Complaint  Patient presents with   Follow-up    Referring provider: No ref. provider found  Colleen Wiggins is a 48 y.o. female with Past Medical History: No date: Gestational diabetes No date: Medical history non-contributory   HPI  Patient presents today for a follow-up visit.  Overall patient is doing well.  She does need refills on medications today.  A1c today in the office is 5.9.  Patient does need labs today.  Patient is concerned about weight.  We will place referral for medical weight management for her today. Denies f/c/s, n/v/d, hemoptysis, PND, leg swelling.      No Known Allergies  Immunization History  Administered Date(s) Administered   Tdap 08/19/2013    Tobacco History: Social History   Tobacco Use  Smoking Status Every Day   Current packs/day: 0.50   Types: Cigarettes  Smokeless Tobacco Never   Ready to quit: No Counseling given: Yes   Outpatient Encounter Medications as of 02/13/2023  Medication Sig   [DISCONTINUED] FLUoxetine  (PROZAC ) 20 MG capsule TAKE 1 CAPSULE BY MOUTH EVERY DAY   [DISCONTINUED] lamoTRIgine  (LAMICTAL ) 200 MG tablet Take 1 tablet (200 mg total) by mouth daily.   [DISCONTINUED] lisinopril -hydrochlorothiazide  (ZESTORETIC ) 20-12.5 MG tablet Take 1 tablet by mouth daily.   [DISCONTINUED] propranolol  (INDERAL ) 60 MG tablet Take 1 tablet (60 mg total) by mouth daily.   FLUoxetine  (PROZAC ) 20 MG capsule Take 1 capsule (20 mg total) by mouth daily.   lamoTRIgine  (LAMICTAL ) 200 MG tablet Take 1 tablet (200 mg total) by mouth daily.   lisinopril -hydrochlorothiazide  (ZESTORETIC ) 20-12.5 MG tablet Take 1 tablet by mouth daily.   propranolol  (INDERAL ) 60 MG tablet Take 1 tablet (60 mg total) by mouth daily.   triamcinolone  (NASACORT ) 55 MCG/ACT AERO nasal inhaler Place 2 sprays into the nose daily. (Patient not taking: Reported on  02/13/2023)   [DISCONTINUED] Azelastine -Fluticasone  137-50 MCG/ACT SUSP One spray each nostril twice daily.   [DISCONTINUED] cetirizine  (ZYRTEC ) 10 MG tablet Take 1 tablet (10 mg total) by mouth daily.   [DISCONTINUED] FLUoxetine  (PROZAC ) 20 MG capsule Take 1 capsule (20 mg total) by mouth daily.   [DISCONTINUED] lamoTRIgine  (LAMICTAL ) 200 MG tablet Take 1 tablet (200 mg total) by mouth daily.   [DISCONTINUED] lisinopril -hydrochlorothiazide  (ZESTORETIC ) 20-12.5 MG tablet Take 1 tablet by mouth daily.   [DISCONTINUED] norgestrel -ethinyl estradiol  (LO/OVRAL ) 0.3-30 MG-MCG tablet Take 1 tablet by mouth daily.   [DISCONTINUED] propranolol  (INDERAL ) 60 MG tablet Take 1 tablet (60 mg total) by mouth daily.   No facility-administered encounter medications on file as of 02/13/2023.    Review of Systems  Review of Systems  Constitutional: Negative.   HENT: Negative.    Cardiovascular: Negative.   Gastrointestinal: Negative.   Allergic/Immunologic: Negative.   Neurological: Negative.   Psychiatric/Behavioral: Negative.       Objective:   BP 127/75   Pulse 63   Temp (!) 96.6 F (35.9 C)   Wt (!) 313 lb (142 kg)   SpO2 97%   BMI 43.65 kg/m   Wt Readings from Last 5 Encounters:  02/13/23 (!) 313 lb (142 kg)  11/12/22 (!) 313 lb (142 kg)  11/10/22 (!) 316 lb (143.3 kg)  09/01/16 280 lb (127 kg)  12/19/13 269 lb 9.6 oz (122.3 kg)     Physical Exam Vitals and nursing note reviewed.  Constitutional:      General:  She is not in acute distress.    Appearance: She is well-developed.  Cardiovascular:     Rate and Rhythm: Normal rate and regular rhythm.  Pulmonary:     Effort: Pulmonary effort is normal.     Breath sounds: Normal breath sounds.  Neurological:     Mental Status: She is alert and oriented to person, place, and time.       Assessment & Plan:   Class 3 severe obesity due to excess calories without serious comorbidity with body mass index (BMI) of 40.0 to 44.9 in  adult (HCC) -     Amb Ref to Medical Weight Management  Bipolar 1 disorder (HCC) -     FLUoxetine  HCl; Take 1 capsule (20 mg total) by mouth daily.  Dispense: 90 capsule; Refill: 0 -     lamoTRIgine ; Take 1 tablet (200 mg total) by mouth daily.  Dispense: 90 tablet; Refill: 1 -     Propranolol  HCl; Take 1 tablet (60 mg total) by mouth daily.  Dispense: 90 tablet; Refill: 1  Anxiety -     FLUoxetine  HCl; Take 1 capsule (20 mg total) by mouth daily.  Dispense: 90 capsule; Refill: 0 -     lamoTRIgine ; Take 1 tablet (200 mg total) by mouth daily.  Dispense: 90 tablet; Refill: 1 -     Propranolol  HCl; Take 1 tablet (60 mg total) by mouth daily.  Dispense: 90 tablet; Refill: 1  Other orders -     Lisinopril -hydroCHLOROthiazide ; Take 1 tablet by mouth daily.  Dispense: 90 tablet; Refill: 3     Return in about 3 months (around 05/14/2023).   Bascom GORMAN Borer, NP 02/13/2023

## 2023-02-13 NOTE — Patient Instructions (Signed)
 1. Class 3 severe obesity due to excess calories without serious comorbidity with body mass index (BMI) of 40.0 to 44.9 in adult (HCC) (Primary)  - Amb Ref to Medical Weight Management   2. Bipolar 1 disorder (HCC)  - FLUoxetine  (PROZAC ) 20 MG capsule; Take 1 capsule (20 mg total) by mouth daily.  Dispense: 90 capsule; Refill: 0 - lamoTRIgine  (LAMICTAL ) 200 MG tablet; Take 1 tablet (200 mg total) by mouth daily.  Dispense: 90 tablet; Refill: 1 - propranolol  (INDERAL ) 60 MG tablet; Take 1 tablet (60 mg total) by mouth daily.  Dispense: 90 tablet; Refill: 1   3. Anxiety  - FLUoxetine  (PROZAC ) 20 MG capsule; Take 1 capsule (20 mg total) by mouth daily.  Dispense: 90 capsule; Refill: 0 - lamoTRIgine  (LAMICTAL ) 200 MG tablet; Take 1 tablet (200 mg total) by mouth daily.  Dispense: 90 tablet; Refill: 1 - propranolol  (INDERAL ) 60 MG tablet; Take 1 tablet (60 mg total) by mouth daily.  Dispense: 90 tablet; Refill: 1   Follow up:  Follow up in 3 months

## 2023-02-21 ENCOUNTER — Encounter (HOSPITAL_COMMUNITY): Payer: Self-pay

## 2023-02-21 ENCOUNTER — Telehealth (HOSPITAL_COMMUNITY): Payer: 59 | Admitting: Psychiatry

## 2023-02-24 ENCOUNTER — Encounter: Payer: Self-pay | Admitting: Nurse Practitioner

## 2023-03-13 ENCOUNTER — Encounter (HOSPITAL_COMMUNITY): Payer: Self-pay | Admitting: Psychiatry

## 2023-03-13 ENCOUNTER — Telehealth (HOSPITAL_COMMUNITY): Payer: 59 | Admitting: Psychiatry

## 2023-03-13 VITALS — Wt 313.0 lb

## 2023-03-13 DIAGNOSIS — F4310 Post-traumatic stress disorder, unspecified: Secondary | ICD-10-CM

## 2023-03-13 DIAGNOSIS — F319 Bipolar disorder, unspecified: Secondary | ICD-10-CM | POA: Diagnosis not present

## 2023-03-13 DIAGNOSIS — F419 Anxiety disorder, unspecified: Secondary | ICD-10-CM | POA: Diagnosis not present
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
## 2023-06-14 ENCOUNTER — Other Ambulatory Visit: Payer: Self-pay | Admitting: Nurse Practitioner

## 2023-06-14 DIAGNOSIS — F319 Bipolar disorder, unspecified: Secondary | ICD-10-CM

## 2023-06-14 DIAGNOSIS — F419 Anxiety disorder, unspecified: Secondary | ICD-10-CM

## 2023-08-24 ENCOUNTER — Emergency Department (HOSPITAL_BASED_OUTPATIENT_CLINIC_OR_DEPARTMENT_OTHER)
Admission: EM | Admit: 2023-08-24 | Discharge: 2023-08-24 | Disposition: A | Discharge status: Home / Self Care | Payer: Self-pay

## 2023-08-24 ENCOUNTER — Other Ambulatory Visit: Payer: Self-pay

## 2023-08-24 DIAGNOSIS — I10 Essential (primary) hypertension: Secondary | ICD-10-CM | POA: Insufficient documentation

## 2023-08-24 DIAGNOSIS — Z79899 Other long term (current) drug therapy: Secondary | ICD-10-CM | POA: Insufficient documentation

## 2023-08-24 DIAGNOSIS — X088XXA Exposure to other specified smoke, fire and flames, initial encounter: Secondary | ICD-10-CM | POA: Insufficient documentation

## 2023-08-24 DIAGNOSIS — Y9289 Other specified places as the place of occurrence of the external cause: Secondary | ICD-10-CM | POA: Insufficient documentation

## 2023-08-24 DIAGNOSIS — T280XXA Burn of mouth and pharynx, initial encounter: Secondary | ICD-10-CM | POA: Insufficient documentation

## 2023-08-24 MED ORDER — LIDOCAINE VISCOUS HCL 2 % MT SOLN
15.0000 mL | Freq: Once | OROMUCOSAL | Status: AC
  Administered 2023-08-24: 15 mL via OROMUCOSAL
  Filled 2023-08-24: qty 15

## 2023-08-24 MED ORDER — LIDOCAINE VISCOUS HCL 2 % MT SOLN
15.0000 mL | OROMUCOSAL | 0 refills | Status: AC | PRN
  Filled 2023-08-24: qty 100, 5d supply, fill #0
  Filled 2023-08-25: qty 100, 6d supply, fill #0

## 2023-08-24 MED ORDER — OXYCODONE HCL 5 MG PO TABS
5.0000 mg | ORAL_TABLET | Freq: Four times a day (QID) | ORAL | 0 refills | Status: AC | PRN
  Filled 2023-08-24 – 2023-08-25 (×2): qty 6, 2d supply, fill #0

## 2023-08-24 MED ORDER — OXYCODONE HCL 5 MG PO TABS
5.0000 mg | ORAL_TABLET | Freq: Once | ORAL | Status: AC
  Administered 2023-08-24: 5 mg via ORAL
  Filled 2023-08-24: qty 1

## 2023-08-24 NOTE — ED Notes (Signed)
 Pt d/c instructions, medications, and follow-up care reviewed with pt. Pt verbalized understanding and had no further questions at time of d/c. Pt CA&Ox4, ambulatory, and in NAD at time of d/c
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
## 2023-08-25 ENCOUNTER — Other Ambulatory Visit (HOSPITAL_BASED_OUTPATIENT_CLINIC_OR_DEPARTMENT_OTHER): Payer: Self-pay

## 2023-08-25 ENCOUNTER — Other Ambulatory Visit (HOSPITAL_COMMUNITY): Payer: Self-pay

## 2023-08-25 ENCOUNTER — Other Ambulatory Visit: Payer: Self-pay

## 2023-12-21 ENCOUNTER — Encounter: Payer: Self-pay | Admitting: Nurse Practitioner

## 2024-02-27 ENCOUNTER — Other Ambulatory Visit: Payer: Self-pay | Admitting: Nurse Practitioner

## 2024-02-27 DIAGNOSIS — F319 Bipolar disorder, unspecified: Secondary | ICD-10-CM

## 2024-02-27 DIAGNOSIS — F419 Anxiety disorder, unspecified: Secondary | ICD-10-CM
